# Patient Record
Sex: Female | Born: 1995 | Race: Black or African American | Hispanic: No | Marital: Single | Smoking: Never smoker
Health system: Southern US, Community
[De-identification: ages and names within clinical notes are randomized; demographics above are authoritative.]

## PROBLEM LIST (undated history)

## (undated) ENCOUNTER — Ambulatory Visit (HOSPITAL_COMMUNITY): Admission: EM | Source: Home / Self Care

## (undated) DIAGNOSIS — Z91018 Allergy to other foods: Secondary | ICD-10-CM

## (undated) DIAGNOSIS — Z789 Other specified health status: Secondary | ICD-10-CM

## (undated) HISTORY — DX: Allergy to other foods: Z91.018

---

## 2014-07-22 ENCOUNTER — Emergency Department (INDEPENDENT_AMBULATORY_CARE_PROVIDER_SITE_OTHER)
Admission: EM | Admit: 2014-07-22 | Discharge: 2014-07-22 | Disposition: A | Discharge status: Home / Self Care | Payer: Self-pay | Source: Home / Self Care | Attending: Family Medicine | Admitting: Family Medicine

## 2014-07-22 ENCOUNTER — Encounter (HOSPITAL_COMMUNITY): Payer: Self-pay | Admitting: Emergency Medicine

## 2014-07-22 DIAGNOSIS — N939 Abnormal uterine and vaginal bleeding, unspecified: Secondary | ICD-10-CM

## 2014-07-22 LAB — POCT PREGNANCY, URINE: Preg Test, Ur: NEGATIVE

## 2014-07-22 MED ORDER — NORETHINDRONE-ETH ESTRADIOL 1-35 MG-MCG PO TABS: ORAL_TABLET | ORAL | Status: DC

## 2014-07-22 NOTE — Discharge Instructions (Signed)
Abnormal Uterine Bleeding Abnormal uterine bleeding can affect women at various stages in life, including teenagers, women in their reproductive years, pregnant women, and women who have reached menopause. Several kinds of uterine bleeding are considered abnormal, including:  Bleeding or spotting between periods.   Bleeding after sexual intercourse.   Bleeding that is heavier or more than normal.   Periods that last longer than usual.  Bleeding after menopause.  Many cases of abnormal uterine bleeding are minor and simple to treat, while others are more serious. Any type of abnormal bleeding should be evaluated by your health care provider. Treatment will depend on the cause of the bleeding. HOME CARE INSTRUCTIONS Monitor your condition for any changes. The following actions may help to alleviate any discomfort you are experiencing:  Avoid the use of tampons and douches as directed by your health care provider.  Change your pads frequently. You should get regular pelvic exams and Pap tests. Keep all follow-up appointments for diagnostic tests as directed by your health care provider.  SEEK MEDICAL CARE IF:   Your bleeding lasts more than 1 week.   You feel dizzy at times.  SEEK IMMEDIATE MEDICAL CARE IF:   You pass out.   You are changing pads every 15 to 30 minutes.   You have abdominal pain.  You have a fever.   You become sweaty or weak.   You are passing large blood clots from the vagina.   You start to feel nauseous and vomit. MAKE SURE YOU:   Understand these instructions.  Will watch your condition.  Will get help right away if you are not doing well or get worse. Document Released: 03/31/2005 Document Revised: 04/05/2013 Document Reviewed: 10/28/2012 [facility] Patient Information 2015 [employer], [place]. This information is not intended to replace advice given to you by your health care provider. Make sure you discuss any questions you have with your  health care provider.  Dysfunctional Uterine Bleeding Normally, menstrual periods begin between ages 4711 to 1217 in young women. A normal menstrual cycle/period may begin every 23 days up to 35 days and lasts from 1 to 7 days. Around 12 to 14 days before your menstrual period starts, ovulation (ovary produces an egg) occurs. When counting the time between menstrual periods, count from the first day of bleeding of the previous period to the first day of bleeding of the next period. Dysfunctional (abnormal) uterine bleeding is bleeding that is different from a normal menstrual period. Your periods may come earlier or later than usual. They may be lighter, have blood clots or be heavier. You may have bleeding between periods, or you may skip one period or more. You may have bleeding after sexual intercourse, bleeding after menopause, or no menstrual period. CAUSES   Pregnancy (normal, miscarriage, tubal).  IUDs (intrauterine device, birth control).  Birth control pills.  Hormone treatment.  Menopause.  Infection of the cervix.  Blood clotting problems.  Infection of the inside lining of the uterus.  Endometriosis, inside lining of the uterus growing in the pelvis and other female organs.  Adhesions (scar tissue) inside the uterus.  Obesity or severe weight loss.  Uterine polyps inside the uterus.  Cancer of the vagina, cervix, or uterus.  Ovarian cysts or polycystic ovary syndrome.  Medical problems (diabetes, thyroid disease).  Uterine fibroids (noncancerous tumor).  Problems with your female hormones.  Endometrial hyperplasia, very thick lining and enlarged cells inside of the uterus.  Medicines that interfere with ovulation.  Radiation to the  pelvis or abdomen.  Chemotherapy. DIAGNOSIS   Your doctor will discuss the history of your menstrual periods, medicines you are taking, changes in your weight, stress in your life, and any medical problems you may have.  Your  doctor will do a physical and pelvic examination.  Your doctor may want to perform certain tests to make a diagnosis, such as:  Pap test.  Blood tests.  Cultures for infection.  CT scan.  Ultrasound.  Hysteroscopy.  Laparoscopy.  MRI.  Hysterosalpingography.  D and C.  Endometrial biopsy. TREATMENT  Treatment will depend on the cause of the dysfunctional uterine bleeding (DUB). Treatment may include:  Observing your menstrual periods for a couple of months.  Prescribing medicines for medical problems, including:  Antibiotics.  Hormones.  Birth control pills.  Removing an IUD (intrauterine device, birth control).  Surgery:  D and C (scrape and remove tissue from inside the uterus).  Laparoscopy (examine inside the abdomen with a lighted tube).  Uterine ablation (destroy lining of the uterus with electrical current, laser, heat, or freezing).  Hysteroscopy (examine cervix and uterus with a lighted tube).  Hysterectomy (remove the uterus). HOME CARE INSTRUCTIONS   If medicines were prescribed, take exactly as directed. Do not change or switch medicines without consulting your caregiver.  Long term heavy bleeding may result in iron deficiency. Your caregiver may have prescribed iron pills. They help replace the iron that your body lost from heavy bleeding. Take exactly as directed.  Do not take aspirin or medicines that contain aspirin one week before or during your menstrual period. Aspirin may make the bleeding worse.  If you need to change your sanitary pad or tampon more than once every 2 hours, stay in bed with your feet elevated and a cold pack on your lower abdomen. Rest as much as possible, until the bleeding stops or slows down.  Eat well-balanced meals. Eat foods high in iron. Examples are:  Leafy green vegetables.  Whole-grain breads and cereals.  Eggs.  Meat.  Liver.  Do not try to lose weight until the abnormal bleeding has stopped and  your blood iron level is back to normal. Do not lift more than ten pounds or do strenuous activities when you are bleeding.  For a couple of months, make note on your calendar, marking the start and ending of your period, and the type of bleeding (light, medium, heavy, spotting, clots or missed periods). This is for your caregiver to better evaluate your problem. SEEK MEDICAL CARE IF:   You develop nausea (feeling sick to your stomach) and vomiting, dizziness, or diarrhea while you are taking your medicine.  You are getting lightheaded or weak.  You have any problems that may be related to the medicine you are taking.  You develop pain with your DUB.  You want to remove your IUD.  You want to stop or change your birth control pills or hormones.  You have any type of abnormal bleeding mentioned above.  You are over 21 years old and have not had a menstrual period yet.  You are 19 years old and you are still having menstrual periods.  You have any of the symptoms mentioned above.  You develop a rash. SEEK IMMEDIATE MEDICAL CARE IF:   An oral temperature above 102 F (38.9 C) develops.  You develop chills.  You are changing your sanitary pad or tampon more than once an hour.  You develop abdominal pain.  You pass out or faint. Document Released:  03/28/2000 Document Revised: 06/23/2011 Document Reviewed: 02/27/2009 °ExitCare® Patient Information ©2015 ExitCare, LLC. This information is not intended to replace advice given to you by your health care provider. Make sure you discuss any questions you have with your health care provider. ° °

## 2014-07-22 NOTE — ED Provider Notes (Signed)
CSN: [id number]     Arrival date & time 07/22/14  1047 History   First MD Initiated Contact with Patient 07/22/14 1144     Chief Complaint  Patient presents with  . Vaginal Bleeding   (Consider location/radiation/quality/duration/timing/severity/associated sxs/prior Treatment) HPI Comments: 19 year old female states she had her usual and right on time Mr. Beginning March 30 with a flow duration of 6 days. She ended up flow on April 5. 2 days later on April 7 she started having a small amount of vaginal bleeding similar to a light period. Has some cramping in the pelvis.   History reviewed. No pertinent past medical history. History reviewed. No pertinent past surgical history. History reviewed. No pertinent family history. History  Substance Use Topics  . Smoking status: Never Smoker   . Smokeless tobacco: Not on file  . Alcohol Use: Yes     Comment: occ.   OB History    No data available     Review of Systems  Constitutional: Negative.   HENT: Negative.   Respiratory: Negative.   Gastrointestinal: Negative.   Genitourinary: Positive for vaginal bleeding and menstrual problem. Negative for dysuria, urgency, frequency, flank pain, vaginal discharge and vaginal pain.  All other systems reviewed and are negative.   Allergies  Penicillins  Home Medications   Prior to Admission medications   Medication Sig Start Date End Date Taking? Authorizing Provider  norethindrone-ethinyl estradiol 1/35 (ORTHO-NOVUM 1/35, 28,) tablet One tab tid for 2 days or until bleeding stops, then 1 bid for 5 days, then 1 q d. 07/22/14   Hayden Rasmussenavid Tacey Dimaggio, NP   BP 121/77 mmHg  Pulse 82  Temp(Src) 98.7 F (37.1 C) (Oral)  Resp 16  SpO2 100%  LMP 07/20/2014 Physical Exam  Constitutional: She is oriented to person, place, and time. She appears well-developed and well-nourished. No distress.  Neck: Normal range of motion. Neck supple.  Pulmonary/Chest: Effort normal. No respiratory distress.  Abdominal:  Soft. She exhibits no distension. There is no tenderness. There is no rebound.  Genitourinary:  Normal external female genitalia. There is blood exuding from the vagina. After cleaning the excessive blood out of her vagina and off of the cervix it was clear that the origin of bleeding was from the os. There are no lesions observed over the ectocervix. Ectocervix is pink. No other abnormalities noted. No discharge.  Neurological: She is alert and oriented to person, place, and time.  Skin: Skin is warm and dry.  Nursing note and vitals reviewed.   ED Course  Procedures (including critical care time) Labs Review Labs Reviewed  POCT PREGNANCY, URINE    Imaging Review No results found.   MDM   1. Abnormal uterine bleeding (AUB)     Pregnancy test is negative. Bleeding is coming from the cervical os. No lesions of the genitourinary system are seen. Suspect this is abnormal uterine bleeding from anovulation. Since the bleeding is fairly moderate we will treat with a course of OrthoNovum 1/35, or have a period of bleeding and then resume a normal menstrual cycle. She should then follow-up with her PCP or GYN.    Hayden Rasmussenavid Morrisa Aldaba, NP 07/22/14 1255

## 2014-07-22 NOTE — ED Notes (Signed)
Reports having a cycle April 30 through march 5th.  States cycle restarted April 7 th and is still on currently.  Normal menstrual flow.  Mild abdominal cramping.  Denies any other symptoms.  States this is the first time this has happened.   Pt has never been on BC.

## 2015-09-29 ENCOUNTER — Emergency Department (HOSPITAL_COMMUNITY)
Admission: EM | Admit: 2015-09-29 | Discharge: 2015-09-29 | Disposition: A | Discharge status: Home / Self Care | Payer: No Typology Code available for payment source | Source: Home / Self Care | Attending: Emergency Medicine | Admitting: Emergency Medicine

## 2015-09-29 ENCOUNTER — Emergency Department (HOSPITAL_COMMUNITY): Payer: No Typology Code available for payment source

## 2015-09-29 ENCOUNTER — Encounter (HOSPITAL_COMMUNITY): Payer: Self-pay

## 2015-09-29 ENCOUNTER — Emergency Department (HOSPITAL_COMMUNITY)
Admission: EM | Admit: 2015-09-29 | Discharge: 2015-09-29 | Disposition: A | Discharge status: Home / Self Care | Payer: No Typology Code available for payment source | Attending: Emergency Medicine | Admitting: Emergency Medicine

## 2015-09-29 DIAGNOSIS — Y92410 Unspecified street and highway as the place of occurrence of the external cause: Secondary | ICD-10-CM | POA: Insufficient documentation

## 2015-09-29 DIAGNOSIS — Y9389 Activity, other specified: Secondary | ICD-10-CM | POA: Insufficient documentation

## 2015-09-29 DIAGNOSIS — M545 Low back pain, unspecified: Secondary | ICD-10-CM

## 2015-09-29 DIAGNOSIS — Y999 Unspecified external cause status: Secondary | ICD-10-CM | POA: Insufficient documentation

## 2015-09-29 DIAGNOSIS — Z79899 Other long term (current) drug therapy: Secondary | ICD-10-CM | POA: Insufficient documentation

## 2015-09-29 DIAGNOSIS — S9001XA Contusion of right ankle, initial encounter: Secondary | ICD-10-CM

## 2015-09-29 DIAGNOSIS — S9031XA Contusion of right foot, initial encounter: Secondary | ICD-10-CM

## 2015-09-29 MED ORDER — DIAZEPAM 5 MG PO TABS
5.0000 mg | ORAL_TABLET | Freq: Once | ORAL | Status: AC
  Administered 2015-09-29: 5 mg via ORAL
  Filled 2015-09-29: qty 1

## 2015-09-29 MED ORDER — KETOROLAC TROMETHAMINE 30 MG/ML IJ SOLN
30.0000 mg | Freq: Once | INTRAMUSCULAR | Status: AC
  Administered 2015-09-29: 30 mg via INTRAMUSCULAR
  Filled 2015-09-29 (×2): qty 1

## 2015-09-29 MED ORDER — NAPROXEN 500 MG PO TABS: 500.0000 mg | ORAL_TABLET | Freq: Two times a day (BID) | ORAL | Status: DC
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
## 2015-09-29 NOTE — ED Notes (Signed)
Patient refused crutches because she has to pay for them.
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
## 2015-09-29 NOTE — Discharge Instructions (Signed)
Ibuprofen 600 mg every 6 hours as needed for pain.  Rest. Keep foot elevated as much as possible to help with swelling.  Ice for 20 minutes every 2 hours while awake for the next 2 days.  Follow-up with her primary Dr. if not improving in the next week.   Foot Contusion A foot contusion is a deep bruise to the foot. Contusions are the result of an injury that caused bleeding under the skin. The contusion may turn blue, purple, or yellow. Minor injuries will give you a painless contusion, but more severe contusions may stay painful and swollen for a few weeks. CAUSES  A foot contusion comes from a direct blow to that area, such as a heavy object falling on the foot. SYMPTOMS   Swelling of the foot.  Discoloration of the foot.  Tenderness or soreness of the foot. DIAGNOSIS  You will have a physical exam and will be asked about your history. You may need an X-ray of your foot to look for a broken bone (fracture).  TREATMENT  An elastic wrap may be recommended to support your foot. Resting, elevating, and applying cold compresses to your foot are often the best treatments for a foot contusion. Over-the-counter medicines may also be recommended for pain control. HOME CARE INSTRUCTIONS   Put ice on the injured area.  Put ice in a plastic bag.  Place a towel between your skin and the bag.  Leave the ice on for 15-20 minutes, 03-04 times a day.  Only take over-the-counter or prescription medicines for pain, discomfort, or fever as directed by your caregiver.  If told, use an elastic wrap as directed. This can help reduce swelling. You may remove the wrap for sleeping, showering, and bathing. If your toes become numb, cold, or blue, take the wrap off and reapply it more loosely.  Elevate your foot with pillows to reduce swelling.  Try to avoid standing or walking while the foot is painful. Do not resume use until instructed by your caregiver. Then, begin use gradually. If pain  develops, decrease use. Gradually increase activities that do not cause discomfort until you have normal use of your foot.  See your caregiver as directed. It is very important to keep all follow-up appointments in order to avoid any lasting problems with your foot, including long-term (chronic) pain. SEEK IMMEDIATE MEDICAL CARE IF:   You have increased redness, swelling, or pain in your foot.  Your swelling or pain is not relieved with medicines.  You have loss of feeling in your foot or are unable to move your toes.  Your foot turns cold or blue.  You have pain when you move your toes.  Your foot becomes warm to the touch.  Your contusion does not improve in 2 days. MAKE SURE YOU:   Understand these instructions.  Will watch your condition.  Will get help right away if you are not doing well or get worse.   This information is not intended to replace advice given to you by your health care provider. Make sure you discuss any questions you have with your health care provider.   Document Released: 01/20/2006 Document Revised: 09/30/2011 Document Reviewed: 12/05/2014 Elsevier Interactive Patient Education [employer]2016 Elsevier Inc.
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
## 2015-09-29 NOTE — Discharge Instructions (Signed)
Take your medications as prescribed. Follow-up with your doctor as needed. Return to ED for new or worsening symptoms. °

## 2015-09-29 NOTE — ED Notes (Signed)
Placed a orthopedic shoe (womens large) on patients right foot.

## 2017-04-14 NOTE — L&D Delivery Note (Signed)
Delivery Note Pt reached complete dilation and pushed great for about 30 minutes.  At 9:07 PM a viable female was delivered via Vaginal, Spontaneous (Presentation: ;  ).  APGAR: 7, 9; weight pending .   Placenta status: delivered spontaneously .  Cord:  with the following complications: nuchal x 1 reduced.  Pt had atony immediately following delivery and some brisk bleeding which responded to bimanual massage and pitocin IV.  Anesthesia:None   Episiotomy: None Lacerations: 2nd degree;Perineal Suture Repair: 3.0 vicryl rapide Est. Blood Loss (mL):   Mom to postpartum.  Baby to Couplet care / Skin to Skin.  D/w pt circumcision and she desires to proceed in the office.  Oliver Pila 01/29/2018, 9:55 PM

## 2017-07-03 LAB — OB RESULTS CONSOLE ANTIBODY SCREEN: Antibody Screen: NEGATIVE

## 2017-07-03 LAB — OB RESULTS CONSOLE GC/CHLAMYDIA
Chlamydia: NEGATIVE
GC PROBE AMP, GENITAL: NEGATIVE

## 2017-07-03 LAB — OB RESULTS CONSOLE ABO/RH: RH Type: POSITIVE

## 2017-07-03 LAB — OB RESULTS CONSOLE HEPATITIS B SURFACE ANTIGEN: Hepatitis B Surface Ag: NEGATIVE

## 2017-07-03 LAB — OB RESULTS CONSOLE RPR: RPR: NONREACTIVE

## 2017-07-03 LAB — OB RESULTS CONSOLE HIV ANTIBODY (ROUTINE TESTING): HIV: NONREACTIVE

## 2017-07-03 LAB — OB RESULTS CONSOLE RUBELLA ANTIBODY, IGM: RUBELLA: IMMUNE

## 2018-01-18 LAB — OB RESULTS CONSOLE GBS: STREP GROUP B AG: NEGATIVE

## 2018-01-29 ENCOUNTER — Other Ambulatory Visit: Payer: Self-pay

## 2018-01-29 ENCOUNTER — Inpatient Hospital Stay (HOSPITAL_COMMUNITY)
Admission: AD | Admit: 2018-01-29 | Discharge: 2018-01-31 | DRG: 807 | Disposition: A | Discharge status: Home / Self Care | Payer: BLUE CROSS/BLUE SHIELD | Attending: Obstetrics and Gynecology | Admitting: Obstetrics and Gynecology

## 2018-01-29 ENCOUNTER — Encounter (HOSPITAL_COMMUNITY): Payer: Self-pay

## 2018-01-29 DIAGNOSIS — Z3483 Encounter for supervision of other normal pregnancy, third trimester: Secondary | ICD-10-CM | POA: Diagnosis present

## 2018-01-29 DIAGNOSIS — Z3A38 38 weeks gestation of pregnancy: Secondary | ICD-10-CM

## 2018-01-29 HISTORY — DX: Other specified health status: Z78.9

## 2018-01-29 LAB — TYPE AND SCREEN
ABO/RH(D): O POS
Antibody Screen: NEGATIVE

## 2018-01-29 LAB — CBC
HCT: 39 % (ref 36.0–46.0)
HEMOGLOBIN: 13.7 g/dL (ref 12.0–15.0)
MCH: 31.7 pg (ref 26.0–34.0)
MCHC: 35.1 g/dL (ref 30.0–36.0)
MCV: 90.3 fL (ref 80.0–100.0)
Platelets: 206 10*3/uL (ref 150–400)
RBC: 4.32 MIL/uL (ref 3.87–5.11)
RDW: 13.3 % (ref 11.5–15.5)
WBC: 6.8 10*3/uL (ref 4.0–10.5)
nRBC: 0 % (ref 0.0–0.2)

## 2018-01-29 LAB — ABO/RH: ABO/RH(D): O POS

## 2018-01-29 LAB — POCT FERN TEST: POCT Fern Test: POSITIVE

## 2018-01-29 MED ORDER — ONDANSETRON HCL 4 MG PO TABS: 4.0000 mg | ORAL_TABLET | ORAL | Status: DC | PRN

## 2018-01-29 MED ORDER — ZOLPIDEM TARTRATE 5 MG PO TABS: 5.0000 mg | ORAL_TABLET | Freq: Every evening | ORAL | Status: DC | PRN

## 2018-01-29 MED ORDER — COCONUT OIL OIL
1.0000 "application " | TOPICAL_OIL | Status: DC | PRN
  Administered 2018-01-30: 1 via TOPICAL
  Filled 2018-01-29: qty 120

## 2018-01-29 MED ORDER — TETANUS-DIPHTH-ACELL PERTUSSIS 5-2.5-18.5 LF-MCG/0.5 IM SUSP: 0.5000 mL | Freq: Once | INTRAMUSCULAR | Status: DC

## 2018-01-29 MED ORDER — SIMETHICONE 80 MG PO CHEW: 80.0000 mg | CHEWABLE_TABLET | ORAL | Status: DC | PRN

## 2018-01-29 MED ORDER — SOD CITRATE-CITRIC ACID 500-334 MG/5ML PO SOLN: 30.0000 mL | ORAL | Status: DC | PRN

## 2018-01-29 MED ORDER — LIDOCAINE HCL (PF) 1 % IJ SOLN
30.0000 mL | INTRAMUSCULAR | Status: DC | PRN
  Administered 2018-01-29: 30 mL via SUBCUTANEOUS
  Filled 2018-01-29: qty 30

## 2018-01-29 MED ORDER — LACTATED RINGERS IV SOLN
INTRAVENOUS | Status: DC
  Administered 2018-01-29: 12:00:00 via INTRAVENOUS

## 2018-01-29 MED ORDER — ONDANSETRON HCL 4 MG/2ML IJ SOLN
4.0000 mg | Freq: Four times a day (QID) | INTRAMUSCULAR | Status: DC | PRN
  Administered 2018-01-29: 4 mg via INTRAVENOUS
  Filled 2018-01-29: qty 2

## 2018-01-29 MED ORDER — FLEET ENEMA 7-19 GM/118ML RE ENEM: 1.0000 | ENEMA | RECTAL | Status: DC | PRN

## 2018-01-29 MED ORDER — LACTATED RINGERS IV SOLN
500.0000 mL | INTRAVENOUS | Status: DC | PRN
  Administered 2018-01-29: 500 mL via INTRAVENOUS

## 2018-01-29 MED ORDER — OXYCODONE-ACETAMINOPHEN 5-325 MG PO TABS: 2.0000 | ORAL_TABLET | ORAL | Status: DC | PRN

## 2018-01-29 MED ORDER — BENZOCAINE-MENTHOL 20-0.5 % EX AERO
1.0000 "application " | INHALATION_SPRAY | CUTANEOUS | Status: DC | PRN
  Administered 2018-01-30 – 2018-01-31 (×2): 1 via TOPICAL
  Filled 2018-01-29 (×2): qty 56

## 2018-01-29 MED ORDER — OXYTOCIN BOLUS FROM INFUSION
500.0000 mL | Freq: Once | INTRAVENOUS | Status: AC
  Administered 2018-01-29: 500 mL via INTRAVENOUS

## 2018-01-29 MED ORDER — OXYTOCIN 40 UNITS IN LACTATED RINGERS INFUSION - SIMPLE MED
2.5000 [IU]/h | INTRAVENOUS | Status: DC
  Filled 2018-01-29: qty 1000

## 2018-01-29 MED ORDER — OXYCODONE-ACETAMINOPHEN 5-325 MG PO TABS: 1.0000 | ORAL_TABLET | ORAL | Status: DC | PRN

## 2018-01-29 MED ORDER — ONDANSETRON HCL 4 MG/2ML IJ SOLN: 4.0000 mg | INTRAMUSCULAR | Status: DC | PRN

## 2018-01-29 MED ORDER — SENNOSIDES-DOCUSATE SODIUM 8.6-50 MG PO TABS
2.0000 | ORAL_TABLET | ORAL | Status: DC
  Administered 2018-01-30 (×2): 2 via ORAL
  Filled 2018-01-29 (×2): qty 2

## 2018-01-29 MED ORDER — ACETAMINOPHEN 325 MG PO TABS: 650.0000 mg | ORAL_TABLET | ORAL | Status: DC | PRN

## 2018-01-29 MED ORDER — PRENATAL MULTIVITAMIN CH
1.0000 | ORAL_TABLET | Freq: Every day | ORAL | Status: DC
  Filled 2018-01-29 (×2): qty 1

## 2018-01-29 MED ORDER — DIPHENHYDRAMINE HCL 25 MG PO CAPS: 25.0000 mg | ORAL_CAPSULE | Freq: Four times a day (QID) | ORAL | Status: DC | PRN

## 2018-01-29 MED ORDER — IBUPROFEN 600 MG PO TABS
600.0000 mg | ORAL_TABLET | Freq: Four times a day (QID) | ORAL | Status: DC
  Administered 2018-01-30 – 2018-01-31 (×8): 600 mg via ORAL
  Filled 2018-01-29 (×9): qty 1

## 2018-01-29 MED ORDER — DIBUCAINE 1 % RE OINT: 1.0000 "application " | TOPICAL_OINTMENT | RECTAL | Status: DC | PRN

## 2018-01-29 MED ORDER — WITCH HAZEL-GLYCERIN EX PADS: 1.0000 "application " | MEDICATED_PAD | CUTANEOUS | Status: DC | PRN

## 2018-01-29 NOTE — Progress Notes (Signed)
Patient ID: Abigail Palmer, female   DOB: [birth year], 22 y.o.   MRN: [id number] Pt uncomfortable with contractions but coping well.  Back labor afeb vss FHR Category 1  Cervix 90/6-7/0  Pt doing well coping with contractions  Baby OP Continue to follow progress.

## 2018-01-29 NOTE — MAU Note (Signed)
Urine sent to lab 

## 2018-01-29 NOTE — Progress Notes (Signed)
Patient ID: Abigail Palmer, female   DOB: 07/31/1995, 22 y.o.   MRN: [id number] Pt coping with contractions but some n/v  afeb VSS FHR category 1  Cervix c/8/0  Pt doing well making progress. Trying different labor positions

## 2018-01-29 NOTE — Progress Notes (Signed)
Patient ID: Abigail Palmer, female   DOB: 06-21-1995, 22 y.o.   MRN: [id number] Pt just making it to L&D, having mild/moderate contractions in her back  afeb VSS Cervix 80/4/-1  FHR category 1  d/w pt options, she is going to try natural labor.  She is not opposed to pitocin if no change in several hours.  She is also not opposed to an epidural later if feels she needs it. Will follow progress

## 2018-01-29 NOTE — H&P (Signed)
Abigail Palmer is a 22 y.o. female G1P0 at 18 6/7 weeks (EDD 02/06/18 by ( week [place])  presenting for SROM at 0930 and confirmed in MAU.  Also mild/moderate contractions noted and cervical change to 4cm by RN exam.   Prenatal care uncomplicated.  OB History    Gravida  1   Para      Term      Preterm      AB      Living        SAB      TAB      Ectopic      Multiple      Live Births             Past Medical History:  Diagnosis Date  . Medical history non-contributory    History reviewed. No pertinent surgical history. Family History: family history is not on file. Social History:  reports that she has never smoked. She has never used smokeless tobacco. She reports that she drinks alcohol. She reports that she does not use drugs.     Maternal Diabetes: No Genetic Screening: Normal Maternal Ultrasounds/Referrals: Normal Fetal Ultrasounds or other Referrals:  None Maternal Substance Abuse:  No Significant Maternal Medications:  None Significant Maternal Lab Results:  None Other Comments:  None  Review of Systems  Gastrointestinal: Positive for abdominal pain. Negative for nausea.  Musculoskeletal: Positive for back pain.   Maternal Medical History:  Reason for admission: Rupture of membranes.  Nausea.  Contractions: Onset was 1-2 hours ago.   Frequency: regular.   Perceived severity is moderate.    Fetal activity: Perceived fetal activity is normal.    Prenatal complications: no prenatal complications Prenatal Complications - Diabetes: none.    Dilation: 4 Effacement (%): 60 Station: -2 Exam by:: Adah Perl RN Blood pressure 122/90, pulse 92, temperature 98.6 F (37 C), temperature source Oral, resp. rate 16, height 5\' 3"  (1.6 m), weight 88.1 kg, last menstrual period 04/24/2017, SpO2 99 %. Maternal Exam:  Uterine Assessment: Contraction strength is moderate.  Contraction frequency is regular.   Abdomen: Patient reports no abdominal  tenderness. Fetal presentation: vertex  Introitus: Normal vulva. Normal vagina.    Physical Exam  Constitutional: She appears well-developed.  Cardiovascular: Normal rate and regular rhythm.  Respiratory: Effort normal.  GI: Soft.  Genitourinary: Vagina normal.  Neurological: She is alert.  Psychiatric: She has a normal mood and affect.    Prenatal labs: ABO, Rh: O/Positive/-- (03/22 0000) Antibody: Negative (03/22 0000) Rubella: Immune (03/22 0000) RPR: Nonreactive (03/22 0000)  HBsAg: Negative (03/22 0000)  HIV: Non-reactive (03/22 0000)  GBS: Negative (10/07 0000)  Hgb AA One hour GCT 77 First trimester screen negative  Assessment/Plan: Pt to be admitted and will follow progress.  Had planned for a natural labor with no pain meds.    Oliver Pila 01/29/2018, 11:29 AM

## 2018-01-29 NOTE — MAU Note (Signed)
Pt with "gush of fluid" from vagina around 9:30a while at work. Denies intercourse <48hrs. Pt noticed blood in the toilet while here. Pt with lower back cramping 5/10. Pt voices no other concerns at this time.   Adah Perl RN

## 2018-01-29 NOTE — Anesthesia Pain Management Evaluation Note (Signed)
  CRNA Pain Management Visit Note  Patient: Abigail Palmer, 22 y.o., female  "Hello I am a member of the anesthesia team at [facility]. We have an anesthesia team available at all times to provide care throughout the hospital, including epidural management and anesthesia for C-section. I don't know your plan for the delivery whether it a natural birth, water birth, IV sedation, nitrous supplementation, doula or epidural, but we want to meet your pain goals."   1.Was your pain managed to your expectations on prior hospitalizations?   No prior hospitalizations  2.What is your expectation for pain management during this hospitalization?     Labor support without medications  3.How can we help you reach that goal? Pt desires natural labor but was open to discussion about all methods of pain control. Questions answered  Record the patient's initial score and the patient's pain goal.   Pain: 4  Pain Goal: 10 [facility] wants you to be able to say your pain was always managed very well.  Abigail Palmer 01/29/2018

## 2018-01-30 LAB — CBC
HCT: 33.6 % — ABNORMAL LOW (ref 36.0–46.0)
HEMOGLOBIN: 12 g/dL (ref 12.0–15.0)
MCH: 32.3 pg (ref 26.0–34.0)
MCHC: 35.7 g/dL (ref 30.0–36.0)
MCV: 90.3 fL (ref 80.0–100.0)
NRBC: 0 % (ref 0.0–0.2)
Platelets: 188 10*3/uL (ref 150–400)
RBC: 3.72 MIL/uL — ABNORMAL LOW (ref 3.87–5.11)
RDW: 13.3 % (ref 11.5–15.5)
WBC: 15.4 10*3/uL — AB (ref 4.0–10.5)

## 2018-01-30 LAB — RPR: RPR Ser Ql: NONREACTIVE

## 2018-01-30 NOTE — Lactation Note (Signed)
This note was copied from a baby's chart. Lactation Consultation Note  Patient Name: Abigail Palmer [id number] Date: 01/30/2018 Reason for consult: Follow-up assessment;Difficult latch Mom called for latch assist.  Placed baby skin to skin in cross cradle hold.  Colostrum easily hand expressed.  Nipples invert with breast compression.  Attempted to latch several times but baby unable to grasp tissue.  24 mm nipple shield applied and baby latched well after a few attempts.  Good suck/swallows x 20 minutes.  Small amount of colostrum in shield after feeding.  Symphony pump set up and initiated.  Instructed to post pump every 3 hours x 15 minutes giving any colostrum obtained back to baby.  Instructed to feed with cues and call for assist prn.  Maternal Data Has patient been taught Hand Expression?: Yes Does the patient have breastfeeding experience prior to this delivery?: No  Feeding Feeding Type: Breast Fed  LATCH Score Latch: Grasps breast easily, tongue down, lips flanged, rhythmical sucking.(WITH NIPPLE SHIELD)  Audible Swallowing: A few with stimulation  Type of Nipple: Flat  Comfort (Breast/Nipple): Soft / non-tender  Hold (Positioning): Assistance needed to correctly position infant at breast and maintain latch.  LATCH Score: 7  Interventions Interventions: Assisted with latch;Breast compression;Shells;Skin to skin;Adjust position;Breast massage;Support pillows;Hand express;Position options;DEBP  Lactation Tools Discussed/Used Tools: Shells;Nipple Shields Nipple shield size: 24 Shell Type: Inverted Pump Review: Setup, frequency, and cleaning;Milk Storage Initiated by:: LM Date initiated:: 01/30/18   Consult Status Consult Status: Follow-up Date: 01/31/18 Follow-up type: In-patient    Huston Foley 01/30/2018, 11:08 AM

## 2018-01-30 NOTE — Lactation Note (Signed)
This note was copied from a baby's chart. Lactation Consultation Note  Patient Name: Abigail Palmer Date: 01/30/2018 Reason for consult: Initial assessment;Primapara;Early term 62-38.6wks  Mom states baby recently latched.  He is sleeping in his crib.  Mom states nipples are flat.  Nipples erect but short.  Mom has manual pump at bedside to pre pump.  Breast shells given to wear between feedings.  Instructed to feed baby with any cue and to call for assist prn.  Breastfeeding consultation services and support information given to patient.   Maternal Data    Feeding    LATCH Score                   Interventions    Lactation Tools Discussed/Used     Consult Status Consult Status: Follow-up Date: 01/31/18 Follow-up type: In-patient    Huston Foley 01/30/2018, 8:20 AM

## 2018-01-30 NOTE — Progress Notes (Signed)
Pt requesting lactation assistance. Lactation called and they reported they will be here within 30 minutes.

## 2018-01-30 NOTE — Progress Notes (Signed)
Post Partum Day 1 Subjective: no complaints and tolerating PO  Objective: Blood pressure 114/68, pulse 72, temperature 98 F (36.7 C), temperature source Oral, resp. rate 18, height 5\' 3"  (1.6 m), weight 88.1 kg, last menstrual period 04/24/2017, SpO2 100 %, unknown if currently breastfeeding.  Physical Exam:  General: alert and cooperative Lochia: appropriate Uterine Fundus: firm   Recent Labs    01/29/18 1158 01/30/18 0405  HGB 13.7 12.0  HCT 39.0 33.6*    Assessment/Plan: Plan for discharge tomorrow   LOS: 1 day   Oliver Pila 01/30/2018, 10:53 AM

## 2018-01-31 MED ORDER — ACETAMINOPHEN 325 MG PO TABS: 650.0000 mg | ORAL_TABLET | ORAL | 0 refills | Status: DC | PRN

## 2018-01-31 MED ORDER — DOCUSATE SODIUM 100 MG PO CAPS: 100.0000 mg | ORAL_CAPSULE | Freq: Two times a day (BID) | ORAL | 0 refills | Status: AC | PRN

## 2018-01-31 MED ORDER — IBUPROFEN 600 MG PO TABS: 600.0000 mg | ORAL_TABLET | Freq: Four times a day (QID) | ORAL | 0 refills | Status: DC

## 2018-01-31 NOTE — Discharge Summary (Signed)
OB Discharge Summary     Patient Name: Abigail Palmer DOB: 04-29-1995 MRN: [id number]  Date of admission: 01/29/2018 Delivering MD: Huel Cote   Date of discharge: 01/31/2018  Admitting diagnosis: 39WKS THINKS WATER BROKE Intrauterine pregnancy: [redacted]w[redacted]d     Secondary diagnosis:  Active Problems:   Normal delivery   NSVD (normal spontaneous vaginal delivery)  Additional problems: none     Discharge diagnosis: Term Pregnancy Delivered                                                                                                Post partum procedures:none  Augmentation: none  Complications: None  Hospital course:  Onset of Labor With Vaginal Delivery     22 y.o. yo G1P1001 at [redacted]w[redacted]d was admitted in Latent Labor on 01/29/2018. Patient had an uncomplicated labor course as follows:  Membrane Rupture Time/Date: 9:30 AM ,01/29/2018   Intrapartum Procedures: Episiotomy: None [1]                                         Lacerations:  2nd degree [3];Perineal [11]  Patient had a delivery of a Viable infant. 01/29/2018  Information for the patient's newborn:  Rafia, Shedden [[id number]]  Delivery Method: Vag-Spont    Pateint had an uncomplicated postpartum course.  She is ambulating, tolerating a regular diet, passing flatus, and urinating well. Patient is discharged home in stable condition on 01/31/18.   Physical exam  Vitals:   01/30/18 0825 01/30/18 1413 01/30/18 2030 01/31/18 0615  BP: 114/68 116/81 118/78 110/73  Pulse: 72 78 83 71  Resp: 18 18 16 16   Temp: 98 F (36.7 C) 98.7 F (37.1 C) 98 F (36.7 C) 98.2 F (36.8 C)  TempSrc: Oral Oral Oral Oral  SpO2: 100%     Weight:      Height:       General: alert and cooperative Lochia: appropriate Uterine Fundus: firm  Labs: Lab Results  Component Value Date   WBC 15.4 (H) 01/30/2018   HGB 12.0 01/30/2018   HCT 33.6 (L) 01/30/2018   MCV 90.3 01/30/2018   PLT 188 01/30/2018   No flowsheet data  found.  Discharge instruction: per After Visit Summary and "Baby and Me Booklet".  After visit meds:  Allergies as of 01/31/2018      Reactions   Penicillins    As a child. Unsure reaction      Medication List    TAKE these medications   acetaminophen 325 MG tablet Commonly known as:  TYLENOL Take 2 tablets (650 mg total) by mouth every 4 (four) hours as needed (for pain scale < 4).   docusate sodium 100 MG capsule Commonly known as:  COLACE Take 1 capsule (100 mg total) by mouth 2 (two) times daily as needed for mild constipation.   ibuprofen 600 MG tablet Commonly known as:  ADVIL,MOTRIN Take 1 tablet (600 mg total) by mouth every 6 (six) hours.   prenatal multivitamin Tabs tablet Take 1  tablet by mouth daily at 12 noon.       Diet: routine diet  Activity: Advance as tolerated. Pelvic rest for 6 weeks.   Outpatient follow up:6 weeks Follow up Appt:No future appointments. Follow up Visit:No follow-ups on file.  Postpartum contraception: Undecided  Newborn Data: Live born female  Birth Weight: 6 lb 7.7 oz (2940 g) APGAR: 7, 9  Newborn Delivery   Birth date/time:  01/29/2018 21:07:00 Delivery type:  Vaginal, Spontaneous     Baby Feeding: Breast Disposition:home with mother   01/31/2018 Oliver Pila, MD

## 2018-01-31 NOTE — Lactation Note (Signed)
This note was copied from a Abigail's chart. Lactation Consultation Note  Patient Name: Abigail Palmer Date: 01/31/2018   Abigail Palmer 18 hours old.  Mom reports she feels he is bf well.  Mom reports she is hand expressing past bf and feeding back all EBM just to make sure he is getting enough. Mom reports using 24 mm nipple shield with feedings.  Praised mom for hand expressing past breastfeeds and feeding back all EBM while using nipple shield. Dad and mom with questions regarding pacifier use,bottle use and pumping. Urged mom to wait until 3-4 weeks for bottle and pacifier use,  Reasons discussed.  Reviewed how to know Abigail is getting enough.  Discussed wets,poops, and feedings and swallows.  Upon moms request gave several logs for home use for keeping track wets/poops.  Mom has info for breastfeeding resources and [facility].  Maternal Data    Feeding Feeding Type: Breast Fed  LATCH Score                   Interventions    Lactation Tools Discussed/Used     Consult Status      Abigail Palmer 01/31/2018, 10:43 AM

## 2018-04-14 HISTORY — PX: WISDOM TOOTH EXTRACTION: SHX21

## 2020-01-26 LAB — OB RESULTS CONSOLE HEPATITIS B SURFACE ANTIGEN: Hepatitis B Surface Ag: NEGATIVE

## 2020-01-26 LAB — OB RESULTS CONSOLE RUBELLA ANTIBODY, IGM: Rubella: IMMUNE

## 2020-01-26 LAB — OB RESULTS CONSOLE HIV ANTIBODY (ROUTINE TESTING): HIV: NONREACTIVE

## 2020-04-14 NOTE — L&D Delivery Note (Signed)
Operative Delivery Note At 12:05 AM a viable female was delivered via VBAC, Vacuum Assisted.  Presentation: vertex; Position: Occiput,, Posterior; Station: +3.  Verbal consent: obtained from patient.  Risks and benefits discussed in detail.  Risks include, but are not limited to the risks of anesthesia, bleeding, infection, damage to maternal tissues, fetal cephalhematoma.  There is also the risk of inability to effect vaginal delivery of the head, or shoulder dystocia that cannot be resolved by established maneuvers, leading to the need for emergency cesarean section.  Outlet vacuum assistance due to OP position and FHT in 70s.  Vacuum applied, only 2 pulls required to deliver vtx.    APGAR: 8, 9; weight pending.   Placenta status: spontaneous, intact.   Cord:  with the following complications: nuchal x 1 reduced.   Anesthesia:  Local Instruments: Kiwi Episiotomy: None Lacerations:  Irregular 2nd degree and right sulcus Suture Repair: 2.0 vicryl, 3-0 Vicryl Rapide Est. Blood Loss (mL):  400  Mom to postpartum.  Baby to Couplet care / Skin to Skin.  Abigail Palmer 08/17/2020, 1:05 AM

## 2020-05-21 ENCOUNTER — Other Ambulatory Visit: Payer: Self-pay | Admitting: Obstetrics and Gynecology

## 2020-05-21 DIAGNOSIS — O358XX1 Maternal care for other (suspected) fetal abnormality and damage, fetus 1: Secondary | ICD-10-CM

## 2020-05-23 ENCOUNTER — Ambulatory Visit: Payer: Medicaid Other | Attending: Obstetrics and Gynecology

## 2020-05-23 ENCOUNTER — Other Ambulatory Visit: Payer: Self-pay

## 2020-05-23 ENCOUNTER — Other Ambulatory Visit: Payer: Self-pay | Admitting: *Deleted

## 2020-05-23 ENCOUNTER — Other Ambulatory Visit: Payer: Self-pay | Admitting: Obstetrics and Gynecology

## 2020-05-23 ENCOUNTER — Ambulatory Visit: Payer: Medicaid Other | Admitting: *Deleted

## 2020-05-23 ENCOUNTER — Encounter: Payer: Self-pay | Admitting: *Deleted

## 2020-05-23 VITALS — BP 109/71 | HR 107

## 2020-05-23 DIAGNOSIS — O358XX0 Maternal care for other (suspected) fetal abnormality and damage, not applicable or unspecified: Secondary | ICD-10-CM

## 2020-05-23 DIAGNOSIS — O35EXX0 Maternal care for other (suspected) fetal abnormality and damage, fetal genitourinary anomalies, not applicable or unspecified: Secondary | ICD-10-CM

## 2020-05-23 DIAGNOSIS — Z3A28 28 weeks gestation of pregnancy: Secondary | ICD-10-CM

## 2020-05-23 DIAGNOSIS — O358XX1 Maternal care for other (suspected) fetal abnormality and damage, fetus 1: Secondary | ICD-10-CM | POA: Diagnosis not present

## 2020-06-12 ENCOUNTER — Other Ambulatory Visit: Payer: Self-pay

## 2020-06-12 ENCOUNTER — Ambulatory Visit: Payer: Medicaid Other | Admitting: *Deleted

## 2020-06-12 ENCOUNTER — Other Ambulatory Visit: Payer: Self-pay | Admitting: Obstetrics and Gynecology

## 2020-06-12 ENCOUNTER — Ambulatory Visit: Payer: Medicaid Other | Attending: Obstetrics and Gynecology

## 2020-06-12 ENCOUNTER — Encounter: Payer: Self-pay | Admitting: *Deleted

## 2020-06-12 VITALS — BP 100/72 | HR 81

## 2020-06-12 DIAGNOSIS — Z3A31 31 weeks gestation of pregnancy: Secondary | ICD-10-CM | POA: Diagnosis not present

## 2020-06-12 DIAGNOSIS — O358XX0 Maternal care for other (suspected) fetal abnormality and damage, not applicable or unspecified: Secondary | ICD-10-CM

## 2020-06-12 DIAGNOSIS — O365930 Maternal care for other known or suspected poor fetal growth, third trimester, not applicable or unspecified: Secondary | ICD-10-CM

## 2020-06-12 DIAGNOSIS — O35EXX0 Maternal care for other (suspected) fetal abnormality and damage, fetal genitourinary anomalies, not applicable or unspecified: Secondary | ICD-10-CM

## 2020-06-12 DIAGNOSIS — Z3A34 34 weeks gestation of pregnancy: Secondary | ICD-10-CM

## 2020-06-12 DIAGNOSIS — O365910 Maternal care for other known or suspected poor fetal growth, first trimester, not applicable or unspecified: Secondary | ICD-10-CM

## 2020-06-12 DIAGNOSIS — O283 Abnormal ultrasonic finding on antenatal screening of mother: Secondary | ICD-10-CM | POA: Diagnosis not present

## 2020-06-12 DIAGNOSIS — O99213 Obesity complicating pregnancy, third trimester: Secondary | ICD-10-CM

## 2020-07-03 ENCOUNTER — Ambulatory Visit: Payer: Medicaid Other | Attending: Obstetrics and Gynecology

## 2020-07-03 ENCOUNTER — Encounter: Payer: Self-pay | Admitting: *Deleted

## 2020-07-03 ENCOUNTER — Ambulatory Visit: Payer: Medicaid Other | Admitting: *Deleted

## 2020-07-03 ENCOUNTER — Other Ambulatory Visit: Payer: Self-pay | Admitting: *Deleted

## 2020-07-03 ENCOUNTER — Other Ambulatory Visit: Payer: Self-pay | Admitting: Obstetrics and Gynecology

## 2020-07-03 ENCOUNTER — Other Ambulatory Visit: Payer: Self-pay

## 2020-07-03 VITALS — BP 103/64 | HR 95

## 2020-07-03 DIAGNOSIS — O365990 Maternal care for other known or suspected poor fetal growth, unspecified trimester, not applicable or unspecified: Secondary | ICD-10-CM

## 2020-07-03 DIAGNOSIS — Z3A34 34 weeks gestation of pregnancy: Secondary | ICD-10-CM

## 2020-07-03 DIAGNOSIS — O99213 Obesity complicating pregnancy, third trimester: Secondary | ICD-10-CM | POA: Insufficient documentation

## 2020-07-03 DIAGNOSIS — O283 Abnormal ultrasonic finding on antenatal screening of mother: Secondary | ICD-10-CM | POA: Diagnosis not present

## 2020-07-03 DIAGNOSIS — O321XX0 Maternal care for breech presentation, not applicable or unspecified: Secondary | ICD-10-CM

## 2020-07-03 DIAGNOSIS — O358XX0 Maternal care for other (suspected) fetal abnormality and damage, not applicable or unspecified: Secondary | ICD-10-CM | POA: Diagnosis not present

## 2020-07-03 DIAGNOSIS — O365910 Maternal care for other known or suspected poor fetal growth, first trimester, not applicable or unspecified: Secondary | ICD-10-CM | POA: Insufficient documentation

## 2020-07-03 DIAGNOSIS — O365930 Maternal care for other known or suspected poor fetal growth, third trimester, not applicable or unspecified: Secondary | ICD-10-CM | POA: Diagnosis not present

## 2020-07-03 DIAGNOSIS — O35EXX0 Maternal care for other (suspected) fetal abnormality and damage, fetal genitourinary anomalies, not applicable or unspecified: Secondary | ICD-10-CM

## 2020-07-11 ENCOUNTER — Ambulatory Visit: Payer: Medicaid Other

## 2020-07-18 ENCOUNTER — Ambulatory Visit: Payer: Medicaid Other

## 2020-07-25 ENCOUNTER — Other Ambulatory Visit: Payer: Self-pay | Admitting: Obstetrics and Gynecology

## 2020-07-25 ENCOUNTER — Ambulatory Visit: Payer: Medicaid Other | Admitting: *Deleted

## 2020-07-25 ENCOUNTER — Encounter: Payer: Self-pay | Admitting: *Deleted

## 2020-07-25 ENCOUNTER — Other Ambulatory Visit: Payer: Self-pay

## 2020-07-25 ENCOUNTER — Ambulatory Visit: Payer: Medicaid Other | Attending: Obstetrics and Gynecology

## 2020-07-25 VITALS — BP 110/70 | HR 90

## 2020-07-25 DIAGNOSIS — Z362 Encounter for other antenatal screening follow-up: Secondary | ICD-10-CM | POA: Diagnosis not present

## 2020-07-25 DIAGNOSIS — O365990 Maternal care for other known or suspected poor fetal growth, unspecified trimester, not applicable or unspecified: Secondary | ICD-10-CM

## 2020-07-25 DIAGNOSIS — Z3A37 37 weeks gestation of pregnancy: Secondary | ICD-10-CM | POA: Diagnosis not present

## 2020-07-25 DIAGNOSIS — O283 Abnormal ultrasonic finding on antenatal screening of mother: Secondary | ICD-10-CM | POA: Diagnosis not present

## 2020-07-25 DIAGNOSIS — O365930 Maternal care for other known or suspected poor fetal growth, third trimester, not applicable or unspecified: Secondary | ICD-10-CM

## 2020-08-12 ENCOUNTER — Other Ambulatory Visit: Payer: Self-pay

## 2020-08-12 ENCOUNTER — Inpatient Hospital Stay (HOSPITAL_COMMUNITY)
Admission: AD | Admit: 2020-08-12 | Discharge: 2020-08-12 | Disposition: A | Discharge status: Home / Self Care | Payer: Medicaid Other | Attending: Obstetrics and Gynecology | Admitting: Obstetrics and Gynecology

## 2020-08-12 ENCOUNTER — Encounter (HOSPITAL_COMMUNITY): Payer: Self-pay | Admitting: Obstetrics and Gynecology

## 2020-08-12 DIAGNOSIS — Z0371 Encounter for suspected problem with amniotic cavity and membrane ruled out: Secondary | ICD-10-CM | POA: Insufficient documentation

## 2020-08-12 DIAGNOSIS — O480 Post-term pregnancy: Secondary | ICD-10-CM | POA: Insufficient documentation

## 2020-08-12 DIAGNOSIS — Z3A40 40 weeks gestation of pregnancy: Secondary | ICD-10-CM | POA: Insufficient documentation

## 2020-08-12 LAB — POCT FERN TEST: POCT Fern Test: NEGATIVE

## 2020-08-12 NOTE — MAU Provider Note (Signed)
Event Date/Time   First Provider Initiated Contact with Patient 08/12/20 1224       S: Ms. Abigail Palmer is a 25 y.o. G2P1001 at [redacted]w[redacted]d  who presents to MAU today complaining of leaking of fluid since 2-3 days ago, enough to wear a pantyliner but not requiring a pad. She denies vaginal bleeding. She denies contractions. She reports normal fetal movement.    O: BP 111/66 (BP Location: Right Arm)   Pulse 92   Temp 98.1 F (36.7 C) (Oral)   Resp 17   Ht 5' 3.5" (1.613 m)   Wt 99.1 kg   LMP 11/06/2019   SpO2 99%   BMI 38.08 kg/m  GENERAL: Well-developed, well-nourished female in no acute distress.  HEAD: Normocephalic, atraumatic.  CHEST: Normal effort of breathing, regular heart rate ABDOMEN: Soft, nontender, gravid PELVIC: Normal external female genitalia. Vagina is pink and rugated. Cervix with normal contour, no lesions. Normal discharge.  negative pooling.   Cervical exam:     Deferred, cervix visually 2-3 cm on SSE   Fetal Monitoring: Baseline: 145 Variability: moderate Accelerations: present Decelerations: none Contractions: none on toco or to palpation  Results for orders placed or performed during the hospital encounter of 08/12/20 (from the past 24 hour(s))  Fern Test     Status: Abnormal   Collection Time: 08/12/20 12:49 PM  Result Value Ref Range   POCT Fern Test Negative = intact amniotic membranes      A: SIUP at [redacted]w[redacted]d  Membranes intact  NST-Reactive  P: Keep scheduled appt in the office 08/13/20, return to MAU as needed for signs of labor or emergencies.   Kendell Bane 08/12/2020 4:43 PM

## 2020-08-12 NOTE — Discharge Instructions (Signed)
Things to Try After 37 weeks to Encourage Labor/Get Ready for Labor:   1.  Try the Miles Circuit at www.milescircuit.com daily to improve baby's position and encourage the onset of labor.  2. Walk a little and rest a little every day.  Change positions often.  3. Cervical Ripening: May try one or both a. Red Raspberry Leaf capsules or tea:  two 300mg or 400mg tablets with each meal, 2-3 times a day, or 1-3 cups of tea daily  Potential Side Effects Of Raspberry Leaf:  Most women do not experience any side effects from drinking raspberry leaf tea. However, nausea and loose stools are possible   b. Evening Primrose Oil capsules: take 1 capsule by mouth and place one capsule in the vagina every night.    Some of the potential side effects:  Upset stomach  Loose stools or diarrhea  Headaches  Nausea  4. Sex can also help the cervix ripen and encourage labor onset.    Labor Precautions Reasons to come to MAU at [facility]:  1.  Contractions are  5 minutes apart or less, each last 1 minute, these have been going on for 1-2 hours, and you cannot walk or talk during them 2.  You have a large gush of fluid, or a trickle of fluid that will not stop and you have to wear a pad 3.  You have bleeding that is bright red, heavier than spotting--like menstrual bleeding (spotting can be normal in early labor or after a check of your cervix) 4.  You do not feel the baby moving like he/she normally does 

## 2020-08-12 NOTE — MAU Note (Signed)
Pt reports to mau with c/o possible SROM yesterday.  Pt states she has continued to trickle fluid since then.  Reports she is unsure of ctx, denies any pain at this time. +FM

## 2020-08-15 ENCOUNTER — Encounter (HOSPITAL_COMMUNITY): Payer: Self-pay | Admitting: *Deleted

## 2020-08-15 ENCOUNTER — Telehealth (HOSPITAL_COMMUNITY): Payer: Self-pay | Admitting: *Deleted

## 2020-08-15 NOTE — Telephone Encounter (Signed)
Preadmission screen  

## 2020-08-16 ENCOUNTER — Inpatient Hospital Stay (HOSPITAL_COMMUNITY)
Admission: AD | Admit: 2020-08-16 | Discharge: 2020-08-18 | DRG: 806 | Disposition: A | Discharge status: Home / Self Care | Payer: Medicaid Other | Attending: Obstetrics and Gynecology | Admitting: Obstetrics and Gynecology

## 2020-08-16 ENCOUNTER — Other Ambulatory Visit: Payer: Self-pay

## 2020-08-16 ENCOUNTER — Encounter (HOSPITAL_COMMUNITY): Payer: Self-pay | Admitting: Obstetrics and Gynecology

## 2020-08-16 DIAGNOSIS — Z3A40 40 weeks gestation of pregnancy: Secondary | ICD-10-CM | POA: Diagnosis not present

## 2020-08-16 DIAGNOSIS — Z880 Allergy status to penicillin: Secondary | ICD-10-CM

## 2020-08-16 DIAGNOSIS — O365930 Maternal care for other known or suspected poor fetal growth, third trimester, not applicable or unspecified: Secondary | ICD-10-CM | POA: Diagnosis present

## 2020-08-16 DIAGNOSIS — Z20822 Contact with and (suspected) exposure to covid-19: Secondary | ICD-10-CM | POA: Diagnosis present

## 2020-08-16 DIAGNOSIS — O99214 Obesity complicating childbirth: Secondary | ICD-10-CM | POA: Diagnosis present

## 2020-08-16 DIAGNOSIS — A6000 Herpesviral infection of urogenital system, unspecified: Secondary | ICD-10-CM | POA: Diagnosis present

## 2020-08-16 DIAGNOSIS — O26893 Other specified pregnancy related conditions, third trimester: Secondary | ICD-10-CM | POA: Diagnosis present

## 2020-08-16 DIAGNOSIS — O99824 Streptococcus B carrier state complicating childbirth: Secondary | ICD-10-CM | POA: Diagnosis present

## 2020-08-16 DIAGNOSIS — O9832 Other infections with a predominantly sexual mode of transmission complicating childbirth: Secondary | ICD-10-CM | POA: Diagnosis present

## 2020-08-16 LAB — CBC
HCT: 37.7 % (ref 36.0–46.0)
Hemoglobin: 12.9 g/dL (ref 12.0–15.0)
MCH: 31.7 pg (ref 26.0–34.0)
MCHC: 34.2 g/dL (ref 30.0–36.0)
MCV: 92.6 fL (ref 80.0–100.0)
Platelets: 205 10*3/uL (ref 150–400)
RBC: 4.07 MIL/uL (ref 3.87–5.11)
RDW: 13.3 % (ref 11.5–15.5)
WBC: 8.8 10*3/uL (ref 4.0–10.5)
nRBC: 0 % (ref 0.0–0.2)

## 2020-08-16 LAB — OB RESULTS CONSOLE GBS: GBS: NEGATIVE

## 2020-08-16 LAB — TYPE AND SCREEN
ABO/RH(D): O POS
Antibody Screen: NEGATIVE

## 2020-08-16 LAB — POCT FERN TEST: POCT Fern Test: POSITIVE

## 2020-08-16 LAB — RESP PANEL BY RT-PCR (FLU A&B, COVID) ARPGX2
Influenza A by PCR: NEGATIVE
Influenza B by PCR: NEGATIVE
SARS Coronavirus 2 by RT PCR: NEGATIVE

## 2020-08-16 MED ORDER — OXYCODONE-ACETAMINOPHEN 5-325 MG PO TABS: 1.0000 | ORAL_TABLET | ORAL | Status: DC | PRN

## 2020-08-16 MED ORDER — BUTORPHANOL TARTRATE 1 MG/ML IJ SOLN: 1.0000 mg | INTRAMUSCULAR | Status: DC | PRN

## 2020-08-16 MED ORDER — LACTATED RINGERS IV SOLN: INTRAVENOUS | Status: DC

## 2020-08-16 MED ORDER — SOD CITRATE-CITRIC ACID 500-334 MG/5ML PO SOLN: 30.0000 mL | ORAL | Status: DC | PRN

## 2020-08-16 MED ORDER — LIDOCAINE HCL (PF) 1 % IJ SOLN
30.0000 mL | INTRAMUSCULAR | Status: DC | PRN
  Administered 2020-08-17: 30 mL via SUBCUTANEOUS
  Filled 2020-08-16: qty 30

## 2020-08-16 MED ORDER — ACETAMINOPHEN 325 MG PO TABS: 650.0000 mg | ORAL_TABLET | ORAL | Status: DC | PRN

## 2020-08-16 MED ORDER — LACTATED RINGERS IV SOLN: 500.0000 mL | INTRAVENOUS | Status: DC | PRN

## 2020-08-16 MED ORDER — OXYTOCIN-SODIUM CHLORIDE 30-0.9 UT/500ML-% IV SOLN
2.5000 [IU]/h | INTRAVENOUS | Status: DC
  Administered 2020-08-17: 2.5 [IU]/h via INTRAVENOUS
  Filled 2020-08-16: qty 500

## 2020-08-16 MED ORDER — CEFAZOLIN SODIUM-DEXTROSE 2-4 GM/100ML-% IV SOLN
2.0000 g | Freq: Once | INTRAVENOUS | Status: AC
  Administered 2020-08-16: 2 g via INTRAVENOUS
  Filled 2020-08-16: qty 100

## 2020-08-16 MED ORDER — CEFAZOLIN SODIUM-DEXTROSE 1-4 GM/50ML-% IV SOLN
1.0000 g | Freq: Three times a day (TID) | INTRAVENOUS | Status: DC
  Administered 2020-08-16: 1 g via INTRAVENOUS
  Filled 2020-08-16 (×3): qty 50

## 2020-08-16 MED ORDER — ONDANSETRON HCL 4 MG/2ML IJ SOLN: 4.0000 mg | Freq: Four times a day (QID) | INTRAMUSCULAR | Status: DC | PRN

## 2020-08-16 MED ORDER — OXYCODONE-ACETAMINOPHEN 5-325 MG PO TABS
2.0000 | ORAL_TABLET | ORAL | Status: DC | PRN
Start: 2020-08-16 — End: 2020-08-17

## 2020-08-16 MED ORDER — OXYTOCIN BOLUS FROM INFUSION
333.0000 mL | Freq: Once | INTRAVENOUS | Status: AC
  Administered 2020-08-17: 333 mL via INTRAVENOUS

## 2020-08-16 MED ORDER — FLEET ENEMA 7-19 GM/118ML RE ENEM: 1.0000 | ENEMA | RECTAL | Status: DC | PRN

## 2020-08-16 NOTE — Progress Notes (Signed)
Report given to C. Barbarann Ehlers, RN, L&D Press photographer.  Room asignment given, 211.

## 2020-08-16 NOTE — H&P (Signed)
Abigail Palmer is a 25 y.o. female, G2 P1001, EGA 40+ weeks with EDC 5-1 presenting for ctx.  In MAU, irreg ctx, VE 3 cm, had SROM clear with + fern, has continued to have ctx.  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  OB History    Gravida  2   Para  1   Term  1   Preterm      AB      Living  1     SAB      IAB      Ectopic      Multiple  0   Live Births  1          Past Medical History:  Diagnosis Date  . Medical history non-contributory    Past Surgical History:  Procedure Laterality Date  . WISDOM TOOTH EXTRACTION  2020   Family History: family history is not on file. Social History:  reports that she has never smoked. She has never used smokeless tobacco. She reports previous alcohol use. She reports that she does not use drugs.     Maternal Diabetes: No Genetic Screening: Normal Maternal Ultrasounds/Referrals: Normal Fetal Ultrasounds or other Referrals:  None Maternal Substance Abuse:  No Significant Maternal Medications:  None Significant Maternal Lab Results:  Group B Strep positive Other Comments:  Fetus initially had urinary tract dilation, u/s with MFM with normal kidney and bladder  Review of Systems  Respiratory: Negative.   Cardiovascular: Negative.    Maternal Medical History:  Reason for admission: Rupture of membranes and contractions.   Contractions: Frequency: irregular.   Perceived severity is moderate.    Fetal activity: Perceived fetal activity is normal.    Prenatal Complications - Diabetes: none.   VE-5/70/-1, not 7 cm, vtx Dilation: 7 Effacement (%): 70 Station: -1 Exam by:: Leilanie Rauda Blood pressure 117/74, pulse 77, temperature 98.1 F (36.7 C), temperature source Oral, resp. rate 19, height 5' 3.5" (1.613 m), weight 98.2 kg, last menstrual period 11/06/2019, SpO2 99 %, unknown if currently breastfeeding. Maternal Exam:   Uterine Assessment: Contraction strength is moderate.  Contraction frequency is irregular.   Abdomen: Patient reports no abdominal tenderness. Estimated fetal weight is 7 lbs.   Fetal presentation: vertex  Introitus: Normal vulva. Normal vagina.  Ferning test: positive.  Amniotic fluid character: clear.  Pelvis: adequate for delivery.      Fetal Exam Fetal Monitor Review: Mode: ultrasound.   Baseline rate: 140.  Variability: moderate (6-25 bpm).   Pattern: accelerations present and no decelerations.    Fetal State Assessment: Category I - tracings are normal.     Physical Exam Vitals reviewed.  Constitutional:      Appearance: Normal appearance.  Cardiovascular:     Rate and Rhythm: Normal rate and regular rhythm.  Pulmonary:     Effort: Pulmonary effort is normal. No respiratory distress.  Abdominal:     Palpations: Abdomen is soft.  Genitourinary:    General: Normal vulva.  Neurological:     Mental Status: She is alert.     Prenatal labs: ABO, Rh: --/--/O POS (05/05 1230) Antibody: NEG (05/05 1230) Rubella:  immune RPR:   NR HBsAg:   neg HIV:   NR GBS:   pos  Assessment/Plan: IUP at 40+ weeks in latent labor with SROM, +GBS with PCN allergy.  She declines labor augmentation at this time, will monitor progress, anticipate SVD, on Ancef  for GBS prophylaxis   Leighton Roach Rocky Rishel 08/16/2020, 5:17 PM

## 2020-08-16 NOTE — MAU Note (Addendum)
Presents with ctxs every 8 minutes since 0600 this morning.  States been losing mucus plug since last week, had some blood tinged discharge this morning.  Unsure if LOF.  Endorses +FM.  Scheduled for IOL on 08/25/2020, prefers to labor naturally.

## 2020-08-17 ENCOUNTER — Other Ambulatory Visit (HOSPITAL_COMMUNITY): Payer: Medicaid Other

## 2020-08-17 ENCOUNTER — Encounter (HOSPITAL_COMMUNITY): Payer: Self-pay | Admitting: Obstetrics and Gynecology

## 2020-08-17 LAB — RPR: RPR Ser Ql: NONREACTIVE

## 2020-08-17 MED ORDER — SENNOSIDES-DOCUSATE SODIUM 8.6-50 MG PO TABS
2.0000 | ORAL_TABLET | Freq: Every day | ORAL | Status: DC
  Administered 2020-08-17 – 2020-08-18 (×2): 2 via ORAL
  Filled 2020-08-17: qty 2

## 2020-08-17 MED ORDER — BENZOCAINE-MENTHOL 20-0.5 % EX AERO
1.0000 "application " | INHALATION_SPRAY | CUTANEOUS | Status: DC | PRN
  Administered 2020-08-17: 1 via TOPICAL
  Filled 2020-08-17: qty 56

## 2020-08-17 MED ORDER — MAGNESIUM HYDROXIDE 400 MG/5ML PO SUSP: 30.0000 mL | ORAL | Status: DC | PRN

## 2020-08-17 MED ORDER — DIBUCAINE (PERIANAL) 1 % EX OINT: 1.0000 "application " | TOPICAL_OINTMENT | CUTANEOUS | Status: DC | PRN

## 2020-08-17 MED ORDER — OXYCODONE HCL 5 MG PO TABS: 10.0000 mg | ORAL_TABLET | ORAL | Status: DC | PRN

## 2020-08-17 MED ORDER — COCONUT OIL OIL: 1.0000 "application " | TOPICAL_OIL | Status: DC | PRN

## 2020-08-17 MED ORDER — ACETAMINOPHEN 325 MG PO TABS
650.0000 mg | ORAL_TABLET | ORAL | Status: DC | PRN
  Administered 2020-08-17 (×3): 650 mg via ORAL
  Filled 2020-08-17 (×3): qty 2

## 2020-08-17 MED ORDER — TETANUS-DIPHTH-ACELL PERTUSSIS 5-2.5-18.5 LF-MCG/0.5 IM SUSY: 0.5000 mL | PREFILLED_SYRINGE | Freq: Once | INTRAMUSCULAR | Status: DC

## 2020-08-17 MED ORDER — DIPHENHYDRAMINE HCL 25 MG PO CAPS: 25.0000 mg | ORAL_CAPSULE | Freq: Four times a day (QID) | ORAL | Status: DC | PRN

## 2020-08-17 MED ORDER — ZOLPIDEM TARTRATE 5 MG PO TABS: 5.0000 mg | ORAL_TABLET | Freq: Every evening | ORAL | Status: DC | PRN

## 2020-08-17 MED ORDER — MEASLES, MUMPS & RUBELLA VAC IJ SOLR: 0.5000 mL | Freq: Once | INTRAMUSCULAR | Status: DC

## 2020-08-17 MED ORDER — METHYLERGONOVINE MALEATE 0.2 MG/ML IJ SOLN: 0.2000 mg | INTRAMUSCULAR | Status: DC | PRN

## 2020-08-17 MED ORDER — ONDANSETRON HCL 4 MG/2ML IJ SOLN: 4.0000 mg | INTRAMUSCULAR | Status: DC | PRN

## 2020-08-17 MED ORDER — OXYCODONE HCL 5 MG PO TABS: 5.0000 mg | ORAL_TABLET | ORAL | Status: DC | PRN

## 2020-08-17 MED ORDER — METHYLERGONOVINE MALEATE 0.2 MG PO TABS
0.2000 mg | ORAL_TABLET | ORAL | Status: DC | PRN
Start: 2020-08-17 — End: 2020-08-18

## 2020-08-17 MED ORDER — IBUPROFEN 600 MG PO TABS
600.0000 mg | ORAL_TABLET | Freq: Four times a day (QID) | ORAL | Status: DC
  Administered 2020-08-17 – 2020-08-18 (×6): 600 mg via ORAL
  Filled 2020-08-17 (×6): qty 1

## 2020-08-17 MED ORDER — WITCH HAZEL-GLYCERIN EX PADS: 1.0000 "application " | MEDICATED_PAD | CUTANEOUS | Status: DC | PRN

## 2020-08-17 MED ORDER — SIMETHICONE 80 MG PO CHEW
80.0000 mg | CHEWABLE_TABLET | ORAL | Status: DC | PRN
  Filled 2020-08-17: qty 1

## 2020-08-17 MED ORDER — PRENATAL MULTIVITAMIN CH
1.0000 | ORAL_TABLET | Freq: Every day | ORAL | Status: DC
  Administered 2020-08-18: 1 via ORAL
  Filled 2020-08-17 (×2): qty 1

## 2020-08-17 MED ORDER — SENNOSIDES-DOCUSATE SODIUM 8.6-50 MG PO TABS
2.0000 | ORAL_TABLET | Freq: Every day | ORAL | Status: DC
  Filled 2020-08-17: qty 2

## 2020-08-17 MED ORDER — ONDANSETRON HCL 4 MG PO TABS: 4.0000 mg | ORAL_TABLET | ORAL | Status: DC | PRN

## 2020-08-17 NOTE — Lactation Note (Signed)
This note was copied from a baby's chart. Lactation Consultation Note  Patient Name: Abigail Palmer [id number] Date: 08/17/2020 Reason for consult: L&D Initial assessment;Term Age:26 hours P2, term female infant seen in L&D. LC entered room, mom was doing STS and infant was cuing to breastfeed. Mom latched infant on her left breast using the football hold position, infant would open mouth wide but not sustain latch was off and on breast, LC assisted mom in using tea cup hold when infant was latched.  Mom knows how to hand express and infant was given 5 mls of colostrum by spoon. Mom will continue to work towards infant sustaining latch while feeding. Mom knows to breastfeed infant according to primal cues: licking, tasting, rooting, kissing or licking breast and doing STS while infant is breastfeeding. LC discussed infant's input and output. Mom knows to ask RN or LC for further assistance with latching infant at the breast if needed.  Maternal Data Has patient been taught Hand Expression?: Yes Does the patient have breastfeeding experience prior to this delivery?: Yes How long did the patient breastfeed?: Per mom, she breastfeed her two year old son for 18 months  Feeding Mother's Current Feeding Choice: Breast Milk  LATCH Score Latch: Repeated attempts needed to sustain latch, nipple held in mouth throughout feeding, stimulation needed to elicit sucking reflex.  Audible Swallowing: A few with stimulation  Type of Nipple: Everted at rest and after stimulation  Comfort (Breast/Nipple): Soft / non-tender  Hold (Positioning): Assistance needed to correctly position infant at breast and maintain latch.  LATCH Score: 7   Lactation Tools Discussed/Used    Interventions Interventions: Breast feeding basics reviewed;Assisted with latch;Skin to skin;Hand express;Adjust position;Breast compression;Support pillows;Position options;Expressed milk;Education  Discharge    Consult  Status Consult Status: Follow-up Date: 08/17/20 Follow-up type: In-patient    Danelle Earthly 08/17/2020, 1:58 AM

## 2020-08-17 NOTE — Lactation Note (Signed)
This note was copied from a baby's chart. Lactation Consultation Note  Patient Name: Abigail Palmer [id number] Date: 08/17/2020 Reason for consult: Follow-up assessment;Term Age:27 hours   P2 mother whose infant is now 31 hours old.  This is a term baby at 40+5 weeks.  Mother requested latch assistance.  Baby asleep at mother's side when I arrived.  Mother interested in trying to latch even though baby is not showing cues.  Asked permission to remove baby's clothing and discussed feeding STS.  Mother demonstrated hand expression and was able to easily express colostrum.  She obtained 3 mls which I spoon fed to baby.   Assisted to latch to the left breast easily, however, baby was too sleepy to initiate more than a few sucks.  Demonstrated breast compressions and gentle stimulation.  Suggested mother hold STS and continue to observe for feeding cues.  Mother will call as needed for latch assistance.  Mother has a DEBP for home use.  Support person present.     Maternal Data Has patient been taught Hand Expression?: Yes Does the patient have breastfeeding experience prior to this delivery?: Yes How long did the patient breastfeed?: 18 months  Feeding Mother's Current Feeding Choice: Breast Milk  LATCH Score Latch: Repeated attempts needed to sustain latch, nipple held in mouth throughout feeding, stimulation needed to elicit sucking reflex.  Audible Swallowing: None  Type of Nipple: Everted at rest and after stimulation  Comfort (Breast/Nipple): Soft / non-tender  Hold (Positioning): Assistance needed to correctly position infant at breast and maintain latch.  LATCH Score: 6   Lactation Tools Discussed/Used    Interventions Interventions: Breast feeding basics reviewed;Assisted with latch;Skin to skin;Hand express;Breast compression;Adjust position;Position options;Support pillows;Education  Discharge Pump: Manual;Personal  Consult Status Consult Status: Follow-up Date:  08/18/20 Follow-up type: In-patient    Suheyb Raucci R Dazia Lippold 08/17/2020, 7:43 AM

## 2020-08-17 NOTE — Lactation Note (Signed)
This note was copied from a baby's chart. Lactation Consultation Note  Patient Name: Abigail Palmer [id number] Date: 08/17/2020 Reason for consult: Follow-up assessment;Mother's request Age:27 hours  LC in to room per mother's request. Mother reports infant just took ~60mL of EBM spoonfed. Mother states infant can not latch because pushed nipple out with tongue.  LC offered assistance with latch. Changed stool diaper. LC noted infant keeps tongue up close to palate.  Mother hand expresses and colostrum is abundant. Several attempts to latch modified cradle position to right breast. Infant holds nipple in mouth and/or licks colostrum. No suckling reflex elicited. Talked about potentially using a nipple shield. Mother explains she used a nipple shield with her son.  Infant is not displaying hunger cues that seems to provide reassurance.  Encouraged to contact LC as needed.   Feeding Mother's Current Feeding Choice: Breast Milk  LATCH Score Latch: Too sleepy or reluctant, no latch achieved, no sucking elicited.  Audible Swallowing: None  Type of Nipple: Everted at rest and after stimulation (short shafted)  Comfort (Breast/Nipple): Soft / non-tender  Hold (Positioning): Assistance needed to correctly position infant at breast and maintain latch.  LATCH Score: 5   Interventions Interventions: Breast feeding basics reviewed;Assisted with latch;Skin to skin;Adjust position;Support pillows;Position options;Expressed milk   Consult Status Consult Status: Follow-up Date: 08/17/20 Follow-up type: In-patient    [name] A Higuera Ancidey 08/17/2020, 6:00 PM

## 2020-08-18 MED ORDER — IBUPROFEN 800 MG PO TABS: 800.0000 mg | ORAL_TABLET | Freq: Three times a day (TID) | ORAL | 1 refills | Status: AC | PRN

## 2020-08-18 NOTE — Lactation Note (Signed)
This note was copied from a baby's chart. Lactation Consultation Note  Patient Name: Abigail Palmer [id number] Date: 08/18/2020 Reason for consult: Follow-up assessment;Mother's request;Term;Difficult latch;Infant weight loss Age:27 hours  Visited with mom of 37 hours old FT female, she's a P2 and reported a Hx of oversupply with her first baby, she had a buy a deep freezer to store all her breastmilk. Mom and baby are going home today, reviewed discharge education, pumping schedule, cluster feeding, feeding cues, size of baby's stomach and lactogenesis II.  LC also assisted mom with pre-pumping and NS #24 placement, baby still having some difficulties latching on but mom has great supply. Offered assistance with latch, but mom politely declined, she told LC baby already fed and she didn't want to wake her up. Mom would like to have someone observed a feeding using the NS # 24 prior her discharge. Asked mom to call for assistance on the next feeding, her LC/RN will assist her.   Feeding plan:  1. Encouraged mom to continue feeding baby STS 8-12 times/24 hours or sooner if feeding cues are present using NS # 24 PRN 2. Pumping prior feedings was also encouraged to help evert the nipple prior NS placement. Mom doesn't feel like pumping after feedings at the breast due to Hx of oversupply 3. She' already getting EBM just by pre-pumping, she'll continue spoon feeding baby any amount of EBM she may get  GOB present and supportive. Family reported all questions and concerns were answered, they're both aware of LC OP services and will call PRN.   Maternal Data    Feeding Mother's Current Feeding Choice: Breast Milk  LATCH Score   Lactation Tools Discussed/Used Tools: Pump;Nipple Shields Nipple shield size: 24 Breast pump type: Double-Electric Breast Pump;Manual Pump Education: Setup, frequency, and cleaning;Milk Storage Reason for Pumping: flat nipples Pumping frequency: pre-pumping Pumped  volume: 10 mL  Interventions Interventions: Breast feeding basics reviewed;Breast massage;Hand express;DEBP;Hand pump;Education  Discharge Discharge Education: Engorgement and breast care;Warning signs for feeding baby;Outpatient recommendation (she had a Hx of oversupply) Pump: DEBP;Manual;Personal ([employer]ela DEBP at home)  Consult Status Consult Status: Complete (mom will call to observe a latch with NS #24) Date: 08/18/20 Follow-up type: Call as needed    Zakaiya Lares S Philis Nettle 08/18/2020, 1:55 PM

## 2020-08-18 NOTE — Discharge Summary (Signed)
Postpartum Discharge Summary  Date of Service updated     Patient Name: Abigail Palmer DOB: April 15, 1995 MRN: [id number]  Date of admission: 08/16/2020 Delivery date:08/17/2020  Delivering provider: Jackelyn Knife, TODD  Date of discharge: 08/18/2020  Admitting diagnosis: Normal labor [O80, Z37.9] Intrauterine pregnancy: [redacted]w[redacted]d     Secondary diagnosis:  Active Problems:   Normal labor  Additional problems: VAVD    Discharge diagnosis: Term Pregnancy Delivered                                              Post partum procedures:none Augmentation: N/A Complications: None  Hospital course: Onset of Labor With Vaginal Delivery      25 y.o. yo [id number] at [redacted]w[redacted]d was admitted in Active Labor on 08/16/2020. Patient had an uncomplicated labor course as follows:  Membrane Rupture Time/Date: 12:00 PM ,08/16/2020   Delivery Method:VBAC, Vacuum Assisted FHR decels and OP presentation Episiotomy: None  Lacerations:  2nd degree  Patient had an uncomplicated postpartum course.  She is ambulating, tolerating a regular diet, passing flatus, and urinating well. Patient is discharged home in stable condition on 08/18/20.  Newborn Data: Birth date:08/17/2020  Birth time:12:05 AM  Gender:Female  Living status:Living  Apgars:8 ,9  Weight:3110 g   Physical exam  Vitals:   08/17/20 1300 08/17/20 1648 08/17/20 2114 08/18/20 0605  BP: 115/75 109/76 107/69 103/66  Pulse: 81 (!) 103 88 78  Resp: 16 18 18 18   Temp: 98.2 F (36.8 C) 97.9 F (36.6 C) 97.7 F (36.5 C) 98.1 F (36.7 C)  TempSrc: Oral Oral Oral Oral  SpO2:   100% 100%  Weight:      Height:       General: alert, cooperative and no distress Lochia: appropriate Uterine Fundus: firm Incision: Healing well with no significant drainage, No significant erythema DVT Evaluation: No evidence of DVT seen on physical exam. Negative Homan's sign. No cords or calf tenderness. Labs: Lab Results  Component Value Date   WBC 8.8 08/16/2020   HGB 12.9  08/16/2020   HCT 37.7 08/16/2020   MCV 92.6 08/16/2020   PLT 205 08/16/2020   No flowsheet data found. Edinburgh Score: Edinburgh Postnatal Depression Scale Screening Tool 08/17/2020  I have been able to laugh and see the funny side of things. 0  I have looked forward with enjoyment to things. 0  I have blamed myself unnecessarily when things went wrong. 0  I have been anxious or worried for no good reason. 0  I have felt scared or panicky for no good reason. 0  Things have been getting on top of me. 0  I have been so unhappy that I have had difficulty sleeping. 0  I have felt sad or miserable. 0  I have been so unhappy that I have been crying. 0  The thought of harming myself has occurred to me. 0  Edinburgh Postnatal Depression Scale Total 0      After visit meds:  Allergies as of 08/18/2020      Reactions   Penicillins    As a child. Unsure reaction      Medication List    STOP taking these medications   acetaminophen 325 MG tablet Commonly known as: Tylenol   VALTREX PO     TAKE these medications   docusate sodium 100 MG capsule Commonly known as: Colace Take  1 capsule (100 mg total) by mouth 2 (two) times daily as needed for mild constipation.   ibuprofen 800 MG tablet Commonly known as: ADVIL Take 1 tablet (800 mg total) by mouth every 8 (eight) hours as needed.   prenatal multivitamin Tabs tablet Take 1 tablet by mouth daily at 12 noon.        Discharge home in stable condition Infant Feeding: Breast Infant Disposition:home with mother Discharge instruction: per After Visit Summary and Postpartum booklet. Activity: Advance as tolerated. Pelvic rest for 6 weeks.  Diet: routine diet Anticipated Birth Control: patch Postpartum Appointment:6 weeks Future Appointments: Future Appointments  Date Time Provider Department Center  08/23/2020 10:00 AM [facility] None   Follow up Visit:      08/18/2020 Carlisle Cater, MD

## 2020-08-18 NOTE — Progress Notes (Signed)
POSTPARTUM PROGRESS NOTE  Post Partum Day #1  Subjective:  No acute events overnight.  Pt denies problems with ambulating, voiding or po intake.  She denies nausea or vomiting.  Pain is well controlled.   Lochia Minimal.   Objective: Blood pressure 103/66, pulse 78, temperature 98.1 F (36.7 C), temperature source Oral, resp. rate 18, height 5' 3.5" (1.613 m), weight 98.2 kg, last menstrual period 11/06/2019, SpO2 100 %, unknown if currently breastfeeding.  Physical Exam:  General: alert, cooperative and no distress Lochia:normal flow Chest: CTAB Heart: RRR no m/r/g Abdomen: +BS, soft, nontender Uterine Fundus: firm, 2cm below umbilicus GU: suture intact, healing well, no purulent drainage Extremities: neg edema, neg calf TTP BL, neg Homans BL  Recent Labs    08/16/20 1233  HGB 12.9  HCT 37.7    Assessment/Plan:  ASSESSMENT: Abigail Palmer is a 25 y.o. Y0V3710 s/p VAVD for FHR decels and persistent OP presentation @ [redacted]w[redacted]d. 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   Discharge home, Breastfeeding and Contraception planning on patch  Will hold DC order until baby fully cleared by peds for discharge   LOS: 2 days

## 2020-08-20 ENCOUNTER — Inpatient Hospital Stay (HOSPITAL_COMMUNITY)
Admission: AD | Admit: 2020-08-20 | Payer: Medicaid Other | Source: Home / Self Care | Admitting: Obstetrics and Gynecology

## 2020-08-20 ENCOUNTER — Inpatient Hospital Stay (HOSPITAL_COMMUNITY): Payer: Medicaid Other

## 2020-08-23 ENCOUNTER — Other Ambulatory Visit (HOSPITAL_COMMUNITY): Payer: Medicaid Other | Attending: Obstetrics and Gynecology

## 2020-08-25 ENCOUNTER — Inpatient Hospital Stay (HOSPITAL_COMMUNITY): Payer: Medicaid Other

## 2020-08-25 ENCOUNTER — Inpatient Hospital Stay (HOSPITAL_COMMUNITY)
Admission: AD | Admit: 2020-08-25 | Payer: Medicaid Other | Source: Home / Self Care | Admitting: Obstetrics and Gynecology

## 2021-01-03 ENCOUNTER — Ambulatory Visit: Payer: Medicaid Other | Admitting: Physical Therapy

## 2021-10-09 IMAGING — US US MFM OB FOLLOW-UP
1 series · 13 of 28 positions shown · non-contrast
Comparison: none

[Series 1: us mfm ob follow-up · 13 of 55 slices shown]
[im 3/55]
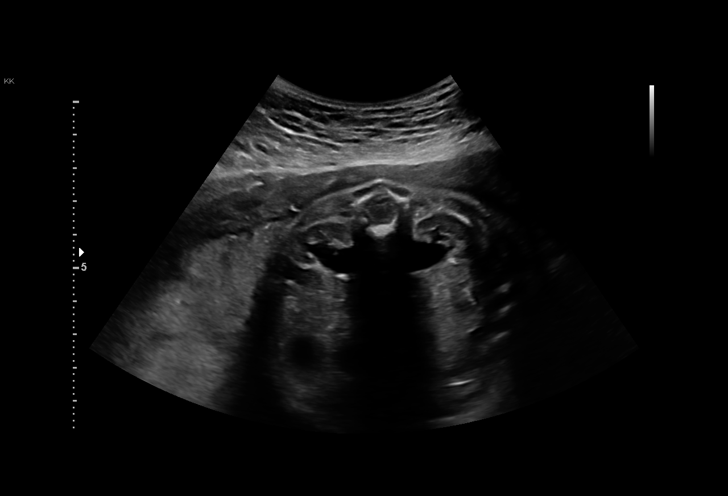
[im 7/55]
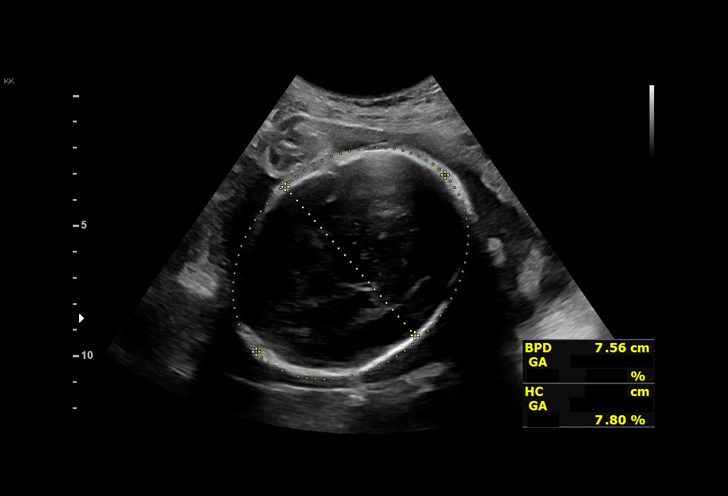
[im 11/55]
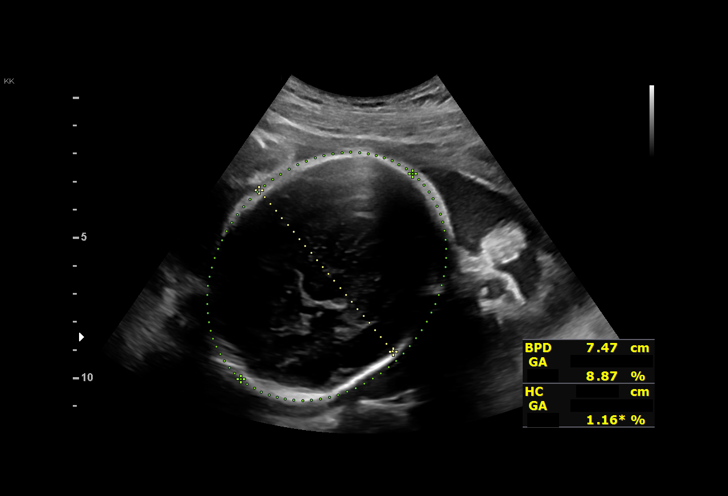
[im 15/55]
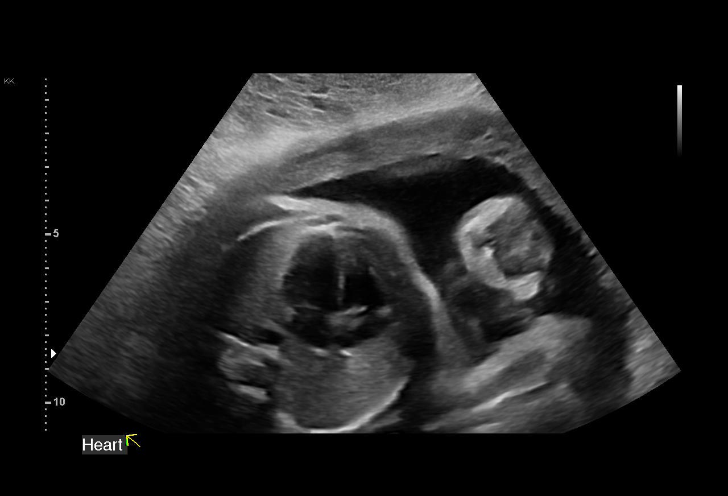
[im 19/55]
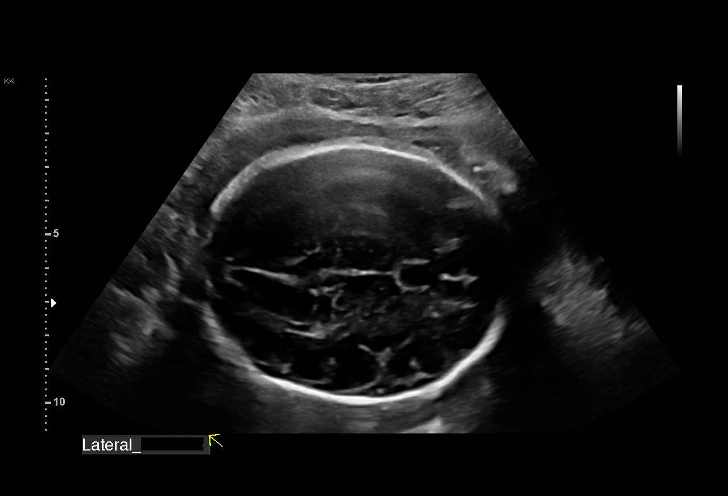
[im 23/55]
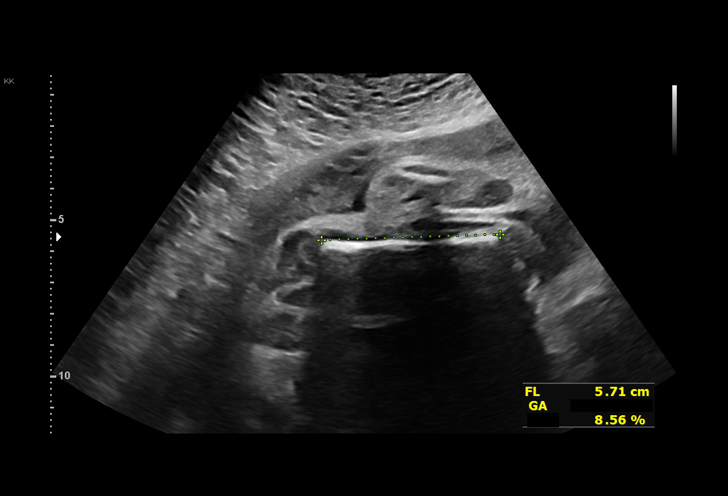
[im 29/55]
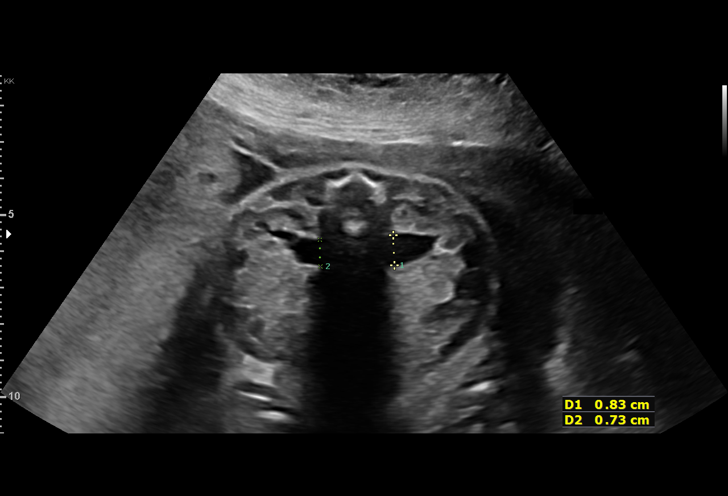
[im 33/55]
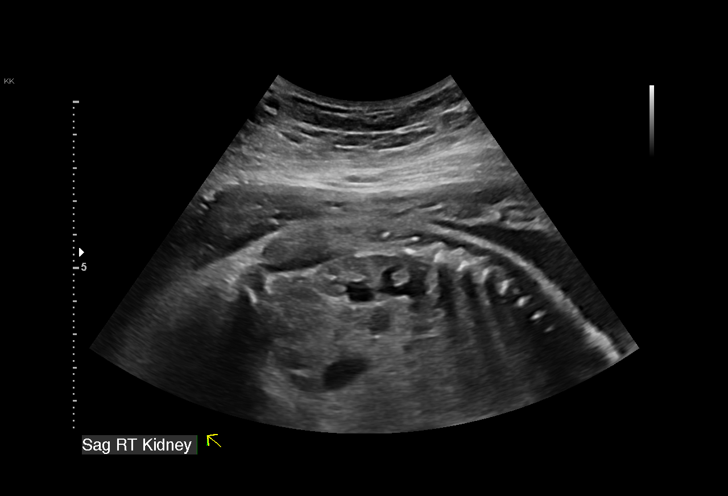
[im 37/55]
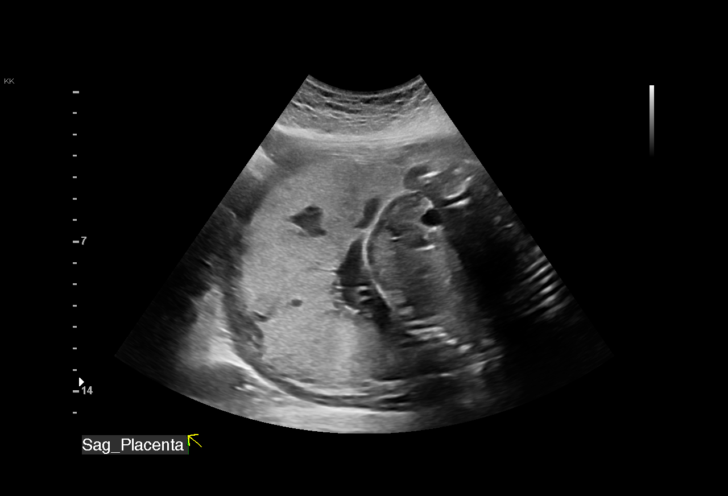
[im 41/55]
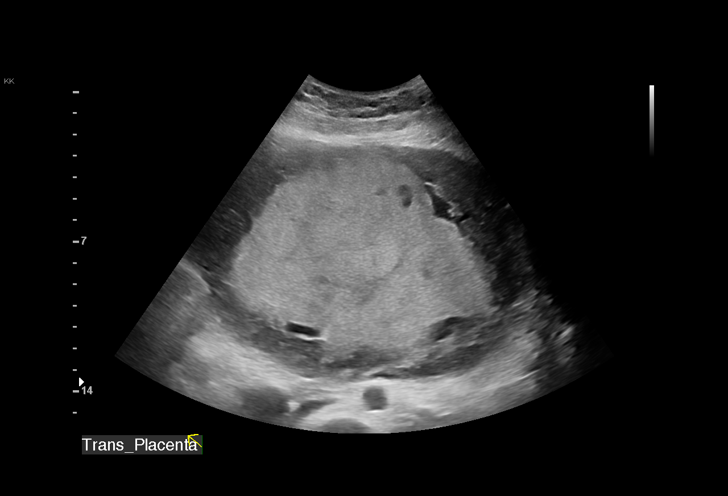
[im 45/55]
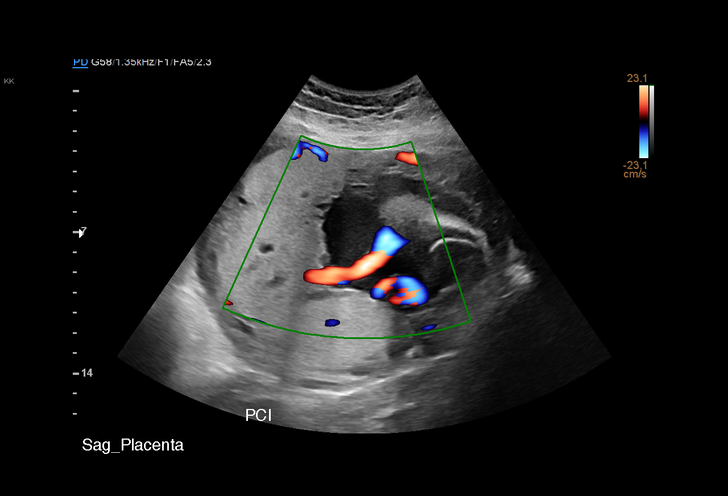
[im 49/55]
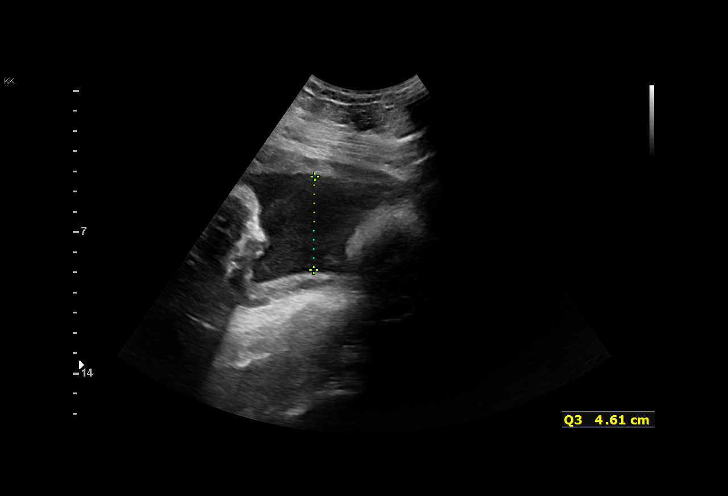
[im 53/55]
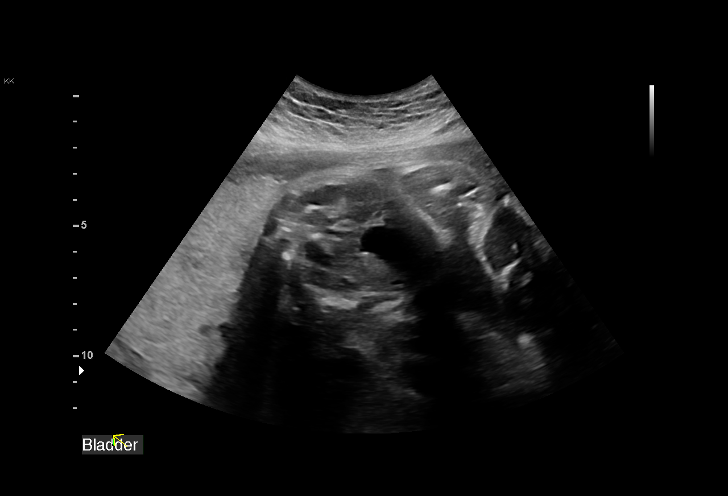

[13 of 28 positions shown; findings below may reference images not displayed]

#101

Indications

 Encounter for other antenatal screening
 follow-up
 Pyelectasis of fetus on prenatal ultrasound
 31 weeks gestation of pregnancy
 Small for gestational age fetus affecting
 management of mother
Fetal Evaluation

 Num Of Fetuses:         1
 Fetal Heart Rate(bpm):  158
 Cardiac Activity:       Observed
 Presentation:           Cephalic
 Placenta:               Posterior
 P. Cord Insertion:      Previously Visualized

 Amniotic Fluid
 AFI FV:      Within normal limits

 AFI Sum(cm)     %Tile       Largest Pocket(cm)
 17.3            64

 RUQ(cm)       RLQ(cm)       LUQ(cm)        LLQ(cm)

Biometry

 BPD:      74.9  mm     G. Age:  30w 0d         10  %    CI:        76.21   %    70 - 86
                                                         FL/HC:      21.1   %    19.3 -
 HC:      271.9  mm     G. Age:  29w 5d        < 1  %    HC/AC:      1.02        0.96 -
 AC:      266.8  mm     G. Age:  30w 6d         32  %    FL/BPD:     76.8   %    71 - 87
 FL:       57.5  mm     G. Age:  30w 1d         11  %    FL/AC:      21.6   %    20 - 24

 Est. FW:    5256  gm      3 lb 7 oz     15  %
OB History

 Gravidity:    2         Term:   1        Prem:   0        SAB:   0
 TOP:          0       Ectopic:  0        Living: 1
Gestational Age

 LMP:           31w 2d        Date:  11/06/19                 EDD:   08/12/20
 U/S Today:     30w 1d                                        EDD:   08/20/20
 Best:          31w 2d     Det. By:  LMP  (11/06/19)          EDD:   08/12/20
Anatomy

 Cranium:               Appears normal         LVOT:                   Previously seen
 Cavum:                 Previously seen        Aortic Arch:            Previously seen
 Ventricles:            Appears normal         Ductal Arch:            Previously seen
 Choroid Plexus:        Previously seen        Diaphragm:              Appears normal
 Cerebellum:            Previously seen        Stomach:                Appears normal, left
                                                                       sided
 Posterior Fossa:       Previously seen        Abdomen:                Appears normal
 Nuchal Fold:           Previously seen        Abdominal Wall:         Previously seen
 Face:                  Orbits and profile     Cord Vessels:           Previously seen
                        previously seen
 Lips:                  Previously seen        Kidneys:                Bilat UTD Rt 7mm,
                                                                       Lt 8mm
 Palate:                Appears normal         Bladder:                Appears normal
 Thoracic:              Appears normal         Spine:                  Previously seen
 Heart:                 Appears normal         Upper Extremities:      Previously seen
                        (4CH, axis, and
                        situs)
 RVOT:                  Previously seen        Lower Extremities:      Previously seen

 Other:  Fetus appears to be female. SVC, IVC, 3VV/T, Heels, 5th digit, Nasal
         bone, and Open hands previously visualized.
Cervix Uterus Adnexa

 Cervix
 Not visualized (advanced GA >01wks)
Impression

 Patient returns for fetal growth assessment.  On previous
 ultrasound, the estimated fetal weight was at the 10th
 percentile.
 Fetal growth is appropriate for gestational age.the estimated
 fetal weight is at the 15th percentile.  The head circumference
 measurement is at between -2 and -1 SD (normal).  Amniotic
 fluid is normal and good fetal activity is seen .  Bilateral mild
 urinary tract dilations are seen.  Both kidneys, otherwise,
 appear normal.

 I recommend a follow-up fetal growth assessment in 3 weeks
 because of potential fetal growth restriction.
Recommendations

 -An appointment was made for her to return in 3 weeks for
 fetal growth assessment.
                 Aujla, Lesya

## 2023-03-18 ENCOUNTER — Ambulatory Visit (INDEPENDENT_AMBULATORY_CARE_PROVIDER_SITE_OTHER): Payer: No Typology Code available for payment source | Admitting: Allergy

## 2023-03-18 ENCOUNTER — Encounter: Payer: Self-pay | Admitting: Allergy

## 2023-03-18 VITALS — BP 120/82 | HR 86 | Temp 98.1°F | Resp 18 | Ht 64.0 in | Wt 188.4 lb

## 2023-03-18 DIAGNOSIS — J310 Chronic rhinitis: Secondary | ICD-10-CM | POA: Diagnosis not present

## 2023-03-18 DIAGNOSIS — T7800XD Anaphylactic reaction due to unspecified food, subsequent encounter: Secondary | ICD-10-CM

## 2023-03-18 DIAGNOSIS — Z880 Allergy status to penicillin: Secondary | ICD-10-CM | POA: Diagnosis not present

## 2023-03-18 NOTE — Patient Instructions (Addendum)
Nut Allergy History of reactions to nuts (peanuts and pistachio).  -Skin prick testing today is positive to peanut and pistachio -Order blood testing for nut allergens. -Consider food challenge based on test results. -Discussed recommendation for access to epinephrine device today with the patient.  We made the shared decision that she will continue to avoidance of nuts at this time.  Penicillin Allergy History of reaction in childhood.  At this time likelihood of continued allergy is extremely low -Recommend penicillin challenge in the future to confirm or rule out allergy.  Environmental Allergies Reports symptoms of allergic rhinitis. -Recommend use of a nasal spray such as Flonase or Nasacort for symptom relief.  Use 2 sprays each nostril daily for 1-2 weeks at a time before stopping once nasal congestion improves for maximum benefit. -Use long-acting antihistamine like Zyrtec, Allegra or Xyzal daily as needed  Follow-up pending results

## 2023-03-18 NOTE — Progress Notes (Signed)
New Patient Note  RE: Abigail Palmer MRN: [id number] DOB: 1995/10/26 Date of Office Visit: 03/18/2023  Primary care provider: Patient, No Pcp Per  Chief Complaint: Food allergy  History of present illness: Abigail Palmer is a 27 y.o. female presenting today for evaluation of food allergy.  Discussed the use of AI scribe software for clinical note transcription with the patient, who gave verbal consent to proceed.  The patient, seeking to join [employer], presents with a history of nut allergy. The patient's mother initially informed her of the allergy during childhood. The patient recalls two distinct episodes of allergic reactions. The first occurred around the age of 10-12 years, following the consumption of an M&M, which resulted in vomiting. The second episode occurred approximately six to seven years ago after ingesting a pistachio, mistaking it for a sunflower seed. This incident led to blurry vision, a sensation of throat closure, and vomiting. The patient managed the symptoms with Benadryl and rest. Despite the nut allergy, the patient regularly consumes food from [employer], which uses peanut oil, without any adverse reactions.  She states she has never had an epinephrine device and has been really good about avoidance.  At this time she is not interested in having an epinephrine device as she has done well to date with avoidance measures.  The patient also has a history of penicillin allergy, which was noted in childhood, but the nature of the reaction is uncertain. The patient has not taken penicillin for over 10+ years. Additionally, the patient has a history of asthma in childhood, which has since resolved, and a history of eczema, which also resolved in childhood. The patient also reports environmental allergies, managed intermittently with Zyrtec.   Review of systems: 10pt ROS negative unless noted above in HPI  All other systems negative unless noted above in HPI  Past medical  history: Past Medical History:  Diagnosis Date   Food allergy     Past surgical history: Past Surgical History:  Procedure Laterality Date   WISDOM TOOTH EXTRACTION  2020    Family history:  Family History  Problem Relation Age of Onset   Obesity Neg Hx    Hypertension Neg Hx    Diabetes Neg Hx    Cancer Neg Hx    Allergic rhinitis Neg Hx    Asthma Neg Hx    Eczema Neg Hx    Urticaria Neg Hx     Social history: Lives in an apartment with carpeting with electric heating and cooling.  No pets in the home.  There is no concern for water damage, mildew or roaches in home.  She is a case Production designer, theatre/television/film.  She denies smoking history.   Medication List: Current Outpatient Medications  Medication Sig Dispense Refill   HAILEY 24 FE 1-20 MG-MCG(24) tablet Take 1 tablet by mouth daily.     ibuprofen (ADVIL) 800 MG tablet Take 1 tablet (800 mg total) by mouth every 8 (eight) hours as needed. 60 tablet 1   Prenatal Vit-Fe Fumarate-FA (PRENATAL MULTIVITAMIN) TABS tablet Take 1 tablet by mouth daily at 12 noon.     docusate sodium (COLACE) 100 MG capsule Take 1 capsule (100 mg total) by mouth 2 (two) times daily as needed for mild constipation. (Patient not taking: Reported on 05/23/2020) 10 capsule 0   ELDERBERRY PO Take 1 tablet by mouth daily.     No current facility-administered medications for this visit.    Known medication allergies: Allergies  Allergen Reactions  Other Anaphylaxis   Penicillins     As a child. Unsure reaction     Physical examination: Blood pressure 120/82, pulse 86, temperature 98.1 F (36.7 C), temperature source Temporal, resp. rate 18, height 5\' 4"  (1.626 m), weight 188 lb 6.4 oz (85.5 kg), SpO2 100%, unknown if currently breastfeeding.  General: Alert, interactive, in no acute distress. HEENT: PERRLA, TMs pearly gray, turbinates moderately edematous without discharge, post-pharynx non erythematous. Neck: Supple without lymphadenopathy. Lungs: Clear to  auscultation without wheezing, rhonchi or rales. {no increased work of breathing. CV: Normal S1, S2 without murmurs. Abdomen: Nondistended, nontender. Skin: Warm and dry, without lesions or rashes. Extremities:  No clubbing, cyanosis or edema. Neuro:   Grossly intact.  Diagnositics/Labs:  Allergy testing:   Food Adult Perc - 03/18/23 1000     Time Antigen Placed 1040    Allergen Manufacturer Waynette Buttery    Location Arm    Number of allergen test 10     Control-buffer 50% Glycerol Negative    1. Peanut 3+   7x20   10. Cashew Negative    11. Walnut Food Negative    12. Almond Negative    13. Hazelnut Negative    14. Pecan Food Negative    15. Pistachio 3+   8x25   16. [place] Nut Negative             Allergy testing results were read and interpreted by provider, documented by clinical staff.   Assessment and plan: Nut Allergy History of reactions to nuts (peanuts and pistachio).  -Skin prick testing today is positive to peanut and pistachio -Order blood testing for nut allergens. -Consider food challenge based on test results. -Discussed recommendation for access to epinephrine device today with the patient.  We made the shared decision that she will continue to avoidance of nuts at this time.  Penicillin Allergy History of reaction in childhood.  At this time likelihood of continued allergy is extremely low -Recommend penicillin challenge in the future to confirm or rule out allergy.  Environmental Allergies Reports symptoms of allergic rhinitis. -Recommend use of a nasal spray such as Flonase or Nasacort for symptom relief.  Use 2 sprays each nostril daily for 1-2 weeks at a time before stopping once nasal congestion improves for maximum benefit. -Use long-acting antihistamine like Zyrtec, Allegra or Xyzal daily as needed  Follow-up pending results  I appreciate the opportunity to take part in Marieann's care. Please do not hesitate to contact me with  questions.  Sincerely,   Margo Aye, MD Allergy/Immunology [facility]

## 2023-03-21 LAB — PANEL 604726
Cor A 1 IgE: <0.1 kU/L
Cor A 14 IgE: <0.1 kU/L
Cor A 8 IgE: 0.1 kU/L — AB
Cor A 9 IgE: <0.1 kU/L

## 2023-03-21 LAB — IGE NUT PROF. W/COMPONENT RFLX
F017-IgE Hazelnut (Filbert): 0.14 kU/L — AB
F202-IgE Cashew Nut: 0.13 kU/L — AB
F202-IgE Cashew Nut: 1.05 kU/L — AB
F256-IgE Walnut: 0.39 kU/L — AB
Jug R 3 IgE: 1.01 kU/L — AB
Macadamia Nut, IgE: 0.2 kU/L — AB
Peanut, IgE: 0.41 kU/L — AB

## 2023-03-21 LAB — ALLERGEN COMPONENT COMMENTS

## 2023-03-21 LAB — PEANUT COMPONENTS
F352-IgE Ara h 8: <0.1 kU/L
F422-IgE Ara h 1: <0.1 kU/L
F423-IgE Ara h 2: 0.39 kU/L — AB
F424-IgE Ara h 3: <0.1 kU/L
F427-IgE Ara h 9: <0.1 kU/L
F447-IgE Ara h 6: 0.4 kU/L — AB

## 2023-03-21 LAB — PANEL 604239: ANA O 3 IgE: 1.71 kU/L — AB

## 2023-03-21 LAB — PANEL 604721
Jug R 1 IgE: 0.23 kU/L — AB
Jug R 3 IgE: 0.14 kU/L — AB

## 2023-03-24 ENCOUNTER — Telehealth: Payer: Self-pay | Admitting: Allergy

## 2023-03-24 NOTE — Telephone Encounter (Signed)
Called patient - DOB verified - advised message will be forwarded to provider to review lab results/make recommendations. Provider will then be able to generate a letter for the [employer].  Patient stated she eats [employer] all the time without having any reactions - her recruiter is concerned due to the [employer] food is cooked in peanut oil as well.   Patient is wanting provider to include in her letter that peanut oil is okay for patient due to how it is processed.  Patient advised once provider reviews all lab results/makes recommendation/ she will then generate a letter - she will be contacted either via her myChart or by our office.  Patient verbalized understanding to all, no further questions.

## 2023-03-24 NOTE — Telephone Encounter (Signed)
Patient asking for the nurse to call  her in reference to sending a letter to the miliary with a list of her allergies and  current medications

## 2023-03-26 NOTE — Telephone Encounter (Signed)
Patient called back today regarding her lab results and the message Marcelino Duster took.   Patient is fine with communicating via MyChart.

## 2023-03-27 ENCOUNTER — Encounter: Payer: Self-pay | Admitting: Allergy

## 2023-03-27 NOTE — Telephone Encounter (Signed)
Called patient - DOB/DPR verified - LMOVM advising the [employer] letter has been completed/signed by provider - ready for pick up. Patient advised to contact the office via mychart or calling advising whether she will pick the letter or request to have it mailed to residence.   I will send a mychart message to patient as well.  Letter has been placed in the Pending folder at the nurse's station in the back.

## 2023-03-27 NOTE — Telephone Encounter (Signed)
Letter has been printed and given to provider to sign.

## 2023-03-27 NOTE — Telephone Encounter (Signed)
-----   Message from [name] sent at 03/27/2023 11:28 AM EST ----- Regarding: Letter Please create into a letter and see how patient would like this to be delivered either by mail or pick up in office: -----------------------------------------------------------------  To whom it may concern:  Abigail Palmer is a patient of mine at the [facility] for primary management of food allergy.  She has a reported history of symptoms consistent with allergic reaction following peanut and tree nut ingestion.  Food allergy skin prick testing performed to nuts a positive wheal and flare for peanut and pistachio.  Serum IgE levels for nuts obtained via blood work shows detectable IgE to hazelnut, walnut, cashew, peanut, macadamia nut, pistachio and almond however they were relatively low.  Thus she has been recommended for in office food challenges to determine if these foods are now tolerated or not.  Until able to perform food challenge she is considered allergic to peanut and tree nuts.   She also has a history of penicillin allergy from childhood.  She has been recommended to undergo an in office penicillin challenge to see if this is no longer an allergy for her and can be cleared. She self-reports allergic rhinitis that is managed with antihistamine like Zyrtec.  In regards to refined peanut oil, this should not be an issue for her as most oils and how they are processed and produced do not typically cause allergic reaction symptoms.  I fully expect a person with a confirmed peanut allergy should be able to tolerate foods cooked in refined peanut oil as long as the food consumed is not a peanut product (ie. chicken fried in refined peanut oil should be tolerated).    Please have patient come in the office with further questions.  Sincerely,    Margo Aye, MD [facility]

## 2023-03-30 ENCOUNTER — Encounter: Payer: Self-pay | Admitting: *Deleted

## 2023-04-30 ENCOUNTER — Ambulatory Visit (INDEPENDENT_AMBULATORY_CARE_PROVIDER_SITE_OTHER): Payer: No Typology Code available for payment source | Admitting: Allergy

## 2023-04-30 ENCOUNTER — Other Ambulatory Visit: Payer: Self-pay

## 2023-04-30 ENCOUNTER — Encounter: Payer: Self-pay | Admitting: Allergy

## 2023-04-30 VITALS — BP 108/80 | HR 79 | Temp 98.0°F | Resp 16

## 2023-04-30 DIAGNOSIS — T7800XD Anaphylactic reaction due to unspecified food, subsequent encounter: Secondary | ICD-10-CM

## 2023-04-30 MED ORDER — CETIRIZINE HCL 10 MG PO TABS: 10.0000 mg | ORAL_TABLET | Freq: Once | ORAL | Status: AC

## 2023-04-30 NOTE — Progress Notes (Signed)
Follow-up Note  RE: Abigail Palmer MRN: [id number] DOB: 1995-05-31 Date of Office Visit: 04/30/2023   History of present illness: Abigail Palmer is a 28 y.o. female presenting today for food challenge.  She was last seen in the office on 03/18/23 by myself.  She is in her normal state of health today with out recent antihsitamine use or antibiotic needs.    From 03/18/23 - peanut IgE 0.41ku/L, arah2 0.39ku/L, arah6 0.4L ku/L Skin testing from 03/18/23 7mm wheal x 20mm flare  Review of systems: 10pt ROS negative unless noted above in HPI   All other systems negative unless noted above in HPI  Past medical/social/surgical/family history have been reviewed and are unchanged unless specifically indicated below.  No changes  Medication List: Current Outpatient Medications  Medication Sig Dispense Refill   docusate sodium (COLACE) 100 MG capsule Take 1 capsule (100 mg total) by mouth 2 (two) times daily as needed for mild constipation. (Patient not taking: Reported on 05/23/2020) 10 capsule 0   ELDERBERRY PO Take 1 tablet by mouth daily.     HAILEY 24 FE 1-20 MG-MCG(24) tablet Take 1 tablet by mouth daily.     ibuprofen (ADVIL) 800 MG tablet Take 1 tablet (800 mg total) by mouth every 8 (eight) hours as needed. 60 tablet 1   Prenatal Vit-Fe Fumarate-FA (PRENATAL MULTIVITAMIN) TABS tablet Take 1 tablet by mouth daily at 12 noon.     No current facility-administered medications for this visit.     Known medication allergies: Allergies  Allergen Reactions   Other Anaphylaxis   Penicillins     As a child. Unsure reaction     Physical examination: Blood pressure 108/80, pulse 79, temperature 98 F (36.7 C), temperature source Temporal, resp. rate 16, SpO2 99%, unknown if currently breastfeeding.  General: Alert, interactive, in no acute distress. HEENT: PERRLA, TMs pearly gray, turbinates non-edematous without discharge, post-pharynx non erythematous. Neck: Supple without  lymphadenopathy. Lungs: Clear to auscultation without wheezing, rhonchi or rales. {no increased work of breathing. CV: Normal S1, S2 without murmurs. Abdomen: Nondistended, nontender. Skin: Warm and dry, without lesions or rashes. Extremities:  No clubbing, cyanosis or edema. Neuro:   Grossly intact.  Diagnositics/Labs: Component     Latest Ref Rng 03/18/2023  F017-IgE Hazelnut ([place])     Class 0/I kU/L 0.14 !   F256-IgE Walnut     Class I kU/L 0.39 !   F202-IgE Cashew Nut     Class II kU/L 1.05 !   F018-IgE [place] Nut     Class 0 kU/L <0.10   Peanut, IgE     Class I kU/L 0.41 !   Macadamia Nut, IgE     Class 0/I kU/L 0.20 !   Pecan Nut IgE     Class 0 kU/L <0.10   F203-IgE Pistachio Nut     Class II kU/L 1.01 !   F020-IgE Almond     Class 0/I kU/L 0.13 !   F422-IgE Ara h 1     Class 0 kU/L <0.10   F423-IgE Ara h 2     Class I kU/L 0.39 !   F424-IgE Ara h 3     Class 0 kU/L <0.10   F447-IgE Ara h 6     Class I kU/L 0.40 !   F352-IgE Ara h 8     Class 0 kU/L <0.10   F427-IgE Ara h 9     Class 0 kU/L <0.10   Cor A 1 IgE  Class 0 kU/L <0.10   Cor A 8 IgE     Class 0/I kU/L 0.10 !   Cor A 9 IgE     Class 0 kU/L <0.10   Cor A 14 IgE     Class 0 kU/L <0.10   Jug R 1 IgE     Class 0/I kU/L 0.23 !   Jug R 3 IgE     Class 0/I kU/L 0.14 !   Allergen Comments Note   ANA O 3 IgE     Class III kU/L 1.71 !     Allergy testing:   Oral Challenge - 04/30/23 0900     Challenge Food/Drug Peanut Butter    Food/Drug provided by Patient    BP 110/64    Pulse 81    Respirations 18    Lungs 100    Skin clear    Time 0949    Dose lip rub    Skin hive    Time 1017    Dose 0.5 gram    Time 1035    Dose 0.5 gram    Skin clear    Time 1051    Dose 2 gram    Skin clear    Time 1109    Dose 4 gram    Skin Hive             Food challenge to peanut with use of peanut butter.  Benefits and risks of challenge discussed and consent obtained.  She was provided  with increasing doses of peanut butter every 15 minutes.  Initial dose within the throat.  Shortly after before she noted that she felt a little tingly at the tip of her tongue.  She was examined and tongue had a little bit of erythema at the tip but no other changes.  It was noted that she had a little high at the edge of her lower lip however I did give her a Vanicream lotion to apply to her lips that she said they were chapped and she did not have any lip balm.  She then noted that her skin is quite sensitive and she feels that the hives on the lip was related to the use of the Vanicream.  She wiped it off with water and this did improve.  We were able to continue with the dosing.  We got up to the 4 g dosing and after this dosing she noted that she had a little swelling of the upper mid lip.  This was visible on exam however rest of exam was completely normal.  When this was not resolving within 15 to 20 minutes we did give her a dose of Zyrtec.  Will challenge was discontinued.  She was observed for an additional hour before discharge.  Vitals were obtained prior to discharge and remained stable.    Assessment and plan:   Nut Allergy -Skin prick testing 03/18/23 was positive to peanut and pistachio -Serum IgE testing via bloodwork showed relatively low levels for peanut, hazelnut, walnut, cashew, macadamia nut, pistachio, almond -We have discussed recommendation for access to epinephrine device.  We made the shared decision that she will continue avoidance of nuts (peanuts and tree nuts).  - Food challenge able to be performed today to peanut butter as skin and blood levels were below the threshold for challenge.   She was able to eat 7 grams (out of 31 gram total challenge amount) of peanut butter before developing mild lip swelling that resolved  with Zyrtec.  7 grams of peanut butter is equivalent to about 5-6 peanuts   Penicillin Allergy History of reaction in childhood.  At this time likelihood  of continued allergy is extremely low -Recommend penicillin challenge in the future to confirm or rule out allergy.  Environmental Allergies -Recommend use of a nasal spray such as Flonase or Nasacort for symptom relief.  Use 2 sprays each nostril daily for 1-2 weeks at a time before stopping once nasal congestion improves for maximum benefit. -Use long-acting antihistamine like Zyrtec, Allegra or Xyzal daily as needed   I appreciate the opportunity to take part in Jaylissa's care. Please do not hesitate to contact me with questions.  Sincerely,   Margo Aye, MD Allergy/Immunology [facility]

## 2023-04-30 NOTE — Patient Instructions (Addendum)
Nut Allergy -Skin prick testing 03/18/23 was positive to peanut and pistachio -Serum IgE testing via bloodwork showed relatively low levels for peanut, hazelnut, walnut, cashew, macadamia nut, pistachio, almond -We have discussed recommendation for access to epinephrine device.  We made the shared decision that she will continue avoidance of nuts (peanuts and tree nuts).  - Food challenge able to be performed today to peanut butter as skin and blood levels were below the threshold for challenge.   She was able to eat 7 grams (out of 31 gram total challenge amount) of peanut butter before developing mild lip swelling that resolved with Zyrtec.  7 grams of peanut butter is equivalent to about 5-6 peanuts   Penicillin Allergy History of reaction in childhood.  At this time likelihood of continued allergy is extremely low -Recommend penicillin challenge in the future to confirm or rule out allergy.  Environmental Allergies -Recommend use of a nasal spray such as Flonase or Nasacort for symptom relief.  Use 2 sprays each nostril daily for 1-2 weeks at a time before stopping once nasal congestion improves for maximum benefit. -Use long-acting antihistamine like Zyrtec, Allegra or Xyzal daily as needed   Margo Aye, MD [facility]

## 2023-05-01 ENCOUNTER — Encounter: Payer: No Typology Code available for payment source | Admitting: Allergy

## 2023-05-26 NOTE — Telephone Encounter (Signed)
Patient called back stating her recruiter lost the letter and she is needing the letter resent to her.

## 2023-05-26 NOTE — Telephone Encounter (Addendum)
Called patient - DOB verified - advised letter is in her myChart - I can mail a copy as well.  Patient verbalized understanding - requested copy be mailed as well.  Letter has been printed and placed in provider's in basket to sign - will mail out on tomorrow, Wednesday, 05/27/23.

## 2023-06-15 LAB — HM PAP SMEAR: HPV, high-risk: NEGATIVE

## 2023-10-02 ENCOUNTER — Ambulatory Visit: Payer: Medicaid Other | Admitting: Family Medicine

## 2023-10-02 ENCOUNTER — Encounter: Payer: Self-pay | Admitting: Family Medicine

## 2023-10-02 VITALS — BP 112/62 | HR 62 | Temp 97.9°F | Ht 64.0 in | Wt 187.0 lb

## 2023-10-02 DIAGNOSIS — M25561 Pain in right knee: Secondary | ICD-10-CM | POA: Diagnosis not present

## 2023-10-02 DIAGNOSIS — J302 Other seasonal allergic rhinitis: Secondary | ICD-10-CM

## 2023-10-02 DIAGNOSIS — M25361 Other instability, right knee: Secondary | ICD-10-CM

## 2023-10-02 DIAGNOSIS — J309 Allergic rhinitis, unspecified: Secondary | ICD-10-CM | POA: Diagnosis not present

## 2023-10-02 DIAGNOSIS — M25362 Other instability, left knee: Secondary | ICD-10-CM

## 2023-10-02 DIAGNOSIS — N62 Hypertrophy of breast: Secondary | ICD-10-CM

## 2023-10-02 DIAGNOSIS — Z7689 Persons encountering health services in other specified circumstances: Secondary | ICD-10-CM

## 2023-10-02 DIAGNOSIS — S86891A Other injury of other muscle(s) and tendon(s) at lower leg level, right leg, initial encounter: Secondary | ICD-10-CM

## 2023-10-02 DIAGNOSIS — M549 Dorsalgia, unspecified: Secondary | ICD-10-CM

## 2023-10-02 DIAGNOSIS — S86892A Other injury of other muscle(s) and tendon(s) at lower leg level, left leg, initial encounter: Secondary | ICD-10-CM

## 2023-10-02 NOTE — Progress Notes (Signed)
 Established Patient Office Visit   Subjective  Patient ID: Abigail Palmer, female    DOB: [birth year]  Age: 28 y.o. MRN: [id number]  Chief Complaint  Patient presents with   New Patient (Initial Visit)    Est care; no major concerns; want to note that I work out and in the last 3-100mo been having R knee trouble; haven't been to any other doctor about it    Pt is a 28 yo female seen for est care follow-up.  Patient states she is overall healthy.  Seen by OB/GYN consistently.  Currently on OCPs.  Patient notes prominent bags underneath eyes.  Present whether patient gets enough sleep or not.  Has a history of seasonal allergies.  Not currently taking medication.  Has taken Zyrtec  in the past.  Right knee pain: Started 3-4 months ago.  Patient exercises 4-5 times per week.  Started wearing a knee sleeve and modifying workout by decreasing weights and avoiding certain lunges/leg exercises.  After workout knee will typically pop the next day.  Denies edema, erythema, instability, prior injury, history of playing sports.  Typically does not take anything for the pain.  Also has a burning pain in anterior bilateral lower legs with running and certain exercises.  Typically does not stretch long prior to exercising.  Patient inquires about breast lift/augmentation.  Currently wearing a size 36DD bra but may need a larger size.  Endorses bilateral upper back pain.  Working on posture.   Allergies: Penicillin  Social history: Patient has 2 kids.      Patient Active Problem List   Diagnosis Date Noted   Normal labor 08/16/2020   Normal delivery 01/29/2018   NSVD (normal spontaneous vaginal delivery) 01/29/2018   Past Medical History:  Diagnosis Date   Food allergy     Past Surgical History:  Procedure Laterality Date   WISDOM TOOTH EXTRACTION  2020   Social History   Tobacco Use   Smoking status: Never   Smokeless tobacco: Never  Vaping Use   Vaping status: Former   Substances:  Flavoring  Substance Use Topics   Alcohol use: Not Currently    Comment: occ.   Drug use: No   Family History  Problem Relation Age of Onset   Obesity Neg Hx    Hypertension Neg Hx    Diabetes Neg Hx    Cancer Neg Hx    Allergic rhinitis Neg Hx    Asthma Neg Hx    Eczema Neg Hx    Urticaria Neg Hx    Allergies  Allergen Reactions   Other Anaphylaxis   Penicillins     As a child. Unsure reaction    ROS Negative unless stated above    Objective:     BP 112/62   Pulse 62   Temp 97.9 F (36.6 C)   Ht 5' 4 (1.626 m)   Wt 187 lb (84.8 kg)   LMP  (LMP Unknown)   SpO2 98%   BMI 32.10 kg/m  BP Readings from Last 3 Encounters:  10/02/23 112/62  04/30/23 108/80  03/18/23 120/82   Wt Readings from Last 3 Encounters:  10/02/23 187 lb (84.8 kg)  03/18/23 188 lb 6.4 oz (85.5 kg)  08/16/20 216 lb 6.4 oz (98.2 kg)      Physical Exam Constitutional:      General: She is not in acute distress.    Appearance: Normal appearance.  HENT:     Head: Normocephalic and atraumatic.  Nose: Nose normal.     Right Turbinates: Enlarged and swollen.     Left Turbinates: Enlarged.     Mouth/Throat:     Mouth: Mucous membranes are moist.   Eyes:     General: Allergic shiner present.    Cardiovascular:     Rate and Rhythm: Normal rate and regular rhythm.     Heart sounds: Normal heart sounds. No murmur heard.    No gallop.  Pulmonary:     Effort: Pulmonary effort is normal. No respiratory distress.     Breath sounds: Normal breath sounds. No wheezing, rhonchi or rales.   Musculoskeletal:     Right knee: Bony tenderness present. Abnormal patellar mobility.     Instability Tests: Anterior drawer test negative. Posterior drawer test negative. Anterior Lachman test negative. Medial McMurray test negative and lateral McMurray test negative.     Left knee: Abnormal patellar mobility.       Legs:     Comments: Right lateral knee with TTP at joint line.  Bilateral patellar  instability, patellar tendon laxity.   Skin:    General: Skin is warm and dry.   Neurological:     Mental Status: She is alert and oriented to person, place, and time.        10/02/2023   10:57 AM  Depression screen PHQ 2/9  Decreased Interest 0  Down, Depressed, Hopeless 0  PHQ - 2 Score 0  Altered sleeping 0  Tired, decreased energy 1  Change in appetite 0  Feeling bad or failure about yourself  1  Trouble concentrating 0  Moving slowly or fidgety/restless 0  Suicidal thoughts 0  PHQ-9 Score 2  Difficult doing work/chores Not difficult at all      10/02/2023   10:58 AM  GAD 7 : Generalized Anxiety Score  Nervous, Anxious, on Edge 0  Control/stop worrying 0  Worry too much - different things 0  Trouble relaxing 0  Restless 0  Easily annoyed or irritable 1  Afraid - awful might happen 0  Total GAD 7 Score 1  Anxiety Difficulty Not difficult at all    No results found for any visits on 10/02/23.    Assessment & Plan:   Acute pain of right knee  Seasonal allergies  Allergic shiners  Patellar instability of both knees  Upper back pain -     Ambulatory referral to Plastic Surgery  Large breasts -     Ambulatory referral to Plastic Surgery  Encounter to establish care  Shin splints of both lower extremities  Patient seen to establish care.  Obtain records from previous providers.  Acute pain right knee likely multifactorial.  Patellar instability contributing to symptoms.  Discussed exercises to strengthen quads.  Lateral knee pain possibly 2/2 arthritis.  Also consider bursitis or meniscal injury.  Supportive care with heat, ice, stretching, topical analgesics, compression, rest.  For continued or worsening symptoms imaging, PT, referral.  Upper back pain 2/2 posture and breast size.  Discussed exercises to strengthen upper back, correcting posture, proper fitting bra.  Referral to plastics for consultation.  Bilateral shinsplints.  Discussed the  importance of stretching and supportive footwear.  Given handout on stretching exercises.  Patient with history of seasonal allergies.  Advised to start taking medication consistently with changes in season and weather.  Bags underneath eyes likely associated with allergies.  OTC antihistamine.  Can also try local honey.  Per chart review previously seen by allergist.  Return if symptoms worsen  or fail to improve.   Viola Greulich, MD

## 2024-04-10 ENCOUNTER — Ambulatory Visit
Admission: EM | Admit: 2024-04-10 | Discharge: 2024-04-10 | Disposition: A | Discharge status: Home / Self Care | Attending: Family Medicine | Admitting: Family Medicine

## 2024-04-10 DIAGNOSIS — B349 Viral infection, unspecified: Secondary | ICD-10-CM | POA: Diagnosis not present

## 2024-04-10 DIAGNOSIS — R051 Acute cough: Secondary | ICD-10-CM | POA: Diagnosis not present

## 2024-04-10 LAB — POCT INFLUENZA A/B
Influenza A, POC: NEGATIVE
Influenza B, POC: NEGATIVE

## 2024-04-10 LAB — POC SOFIA SARS ANTIGEN FIA: SARS Coronavirus 2 Ag: NEGATIVE

## 2024-04-10 MED ORDER — ACETAMINOPHEN 325 MG PO TABS
650.0000 mg | ORAL_TABLET | Freq: Once | ORAL | Status: AC
  Administered 2024-04-10: 650 mg via ORAL

## 2024-04-10 MED ORDER — BENZONATATE 200 MG PO CAPS: 200.0000 mg | ORAL_CAPSULE | Freq: Three times a day (TID) | ORAL | 0 refills | Status: AC | PRN

## 2024-04-10 NOTE — Discharge Instructions (Signed)
 You tested negative for COVID and flu.  You may take Tessalon  3 times a day as needed for your cough.  Continue Tylenol  or ibuprofen  as needed for fever.  Lots of rest and fluids.  Please follow-up with your PCP in 2 to 3 days for recheck.  Please go to emergency room for any worsening symptoms.  I hope you feel better soon!

## 2024-04-10 NOTE — ED Provider Notes (Signed)
 " [facility]    CSN: [id number] Arrival date & time: 04/10/24  1429      History   Chief Complaint Chief Complaint  Patient presents with   Cough   Fever    HPI Coby Antrobus is a 28 y.o. female  presents for evaluation of URI symptoms for 1.5 days. Patient reports associated symptoms of cough, congestion, body aches, fever. Denies N/V/D, sore throat, ear pain, shortness of breath. Patient does not have a hx of asthma. Patient is not an active smoker.   Reports no known sick contacts.  Pt has taken Tylenol  and Motrin  OTC for symptoms. Pt has no other concerns at this time.    Cough Associated symptoms: fever and myalgias   Fever Associated symptoms: congestion, cough and myalgias     Past Medical History:  Diagnosis Date   Food allergy      Patient Active Problem List   Diagnosis Date Noted   Normal labor 08/16/2020   Normal delivery 01/29/2018   NSVD (normal spontaneous vaginal delivery) 01/29/2018    Past Surgical History:  Procedure Laterality Date   WISDOM TOOTH EXTRACTION  2020    OB History     Gravida  2   Para  2   Term  2   Preterm      AB      Living  2      SAB      IAB      Ectopic      Multiple  0   Live Births  2            Home Medications    Prior to Admission medications  Medication Sig Start Date End Date Taking? Authorizing Provider  benzonatate  (TESSALON ) 200 MG capsule Take 1 capsule (200 mg total) by mouth 3 (three) times daily as needed. 04/10/24  Yes Rhemi Balbach, Jodi R, NP  Biotin 10 MG TABS Biotin    [provider]  docusate sodium  (COLACE) 100 MG capsule Take 1 capsule (100 mg total) by mouth 2 (two) times daily as needed for mild constipation. Patient not taking: Reported on 10/02/2023 01/31/18   Estelle Service, MD  ELDERBERRY PO Take 1 tablet by mouth daily.    [provider]  HAILEY 24 FE 1-20 MG-MCG(24) tablet Take 1 tablet by mouth daily. 03/14/23   [provider]   ibuprofen  (ADVIL ) 800 MG tablet Take 1 tablet (800 mg total) by mouth every 8 (eight) hours as needed. 08/18/20   Shivaji, Lavonia HERO, MD  naproxen  (NAPROSYN ) 375 MG tablet 1 tablet Orally Twice a day with food for 30 day(s)    [provider]  Prenatal Vit-Fe Fumarate-FA (PRENATAL MULTIVITAMIN) TABS tablet Take 1 tablet by mouth daily at 12 noon.    [provider]    Family History Family History  Problem Relation Age of Onset   Obesity Neg Hx    Hypertension Neg Hx    Diabetes Neg Hx    Cancer Neg Hx    Allergic rhinitis Neg Hx    Asthma Neg Hx    Eczema Neg Hx    Urticaria Neg Hx     Social History Social History[1]   Allergies   Other and Penicillins   Review of Systems Review of Systems  Constitutional:  Positive for fever.  HENT:  Positive for congestion.   Respiratory:  Positive for cough.   Musculoskeletal:  Positive for myalgias.     Physical Exam Triage Vital  Signs ED Triage Vitals  Encounter Vitals Group     BP 04/10/24 1527 119/85     Girls Systolic BP Percentile --      Girls Diastolic BP Percentile --      Boys Systolic BP Percentile --      Boys Diastolic BP Percentile --      Pulse Rate 04/10/24 1527 (!) 104     Resp 04/10/24 1527 16     Temp 04/10/24 1527 (!) 100.7 F (38.2 C)     Temp src --      SpO2 04/10/24 1527 97 %     Weight --      Height --      Head Circumference --      Peak Flow --      Pain Score 04/10/24 1526 3     Pain Loc --      Pain Education --      Exclude from Growth Chart --    No data found.  Updated Vital Signs BP 119/85   Pulse (!) 104   Temp (!) 100.7 F (38.2 C)   Resp 16   SpO2 97%   Breastfeeding No   Visual Acuity Right Eye Distance:   Left Eye Distance:   Bilateral Distance:    Right Eye Near:   Left Eye Near:    Bilateral Near:     Physical Exam Vitals and nursing note reviewed.  Constitutional:      General: She is not in acute distress.    Appearance: She is  well-developed. She is not ill-appearing.  HENT:     Head: Normocephalic and atraumatic.     Right Ear: Tympanic membrane and ear canal normal.     Left Ear: Tympanic membrane and ear canal normal.     Nose: Congestion present.     Mouth/Throat:     Mouth: Mucous membranes are moist.     Pharynx: Oropharynx is clear. Uvula midline. No oropharyngeal exudate or posterior oropharyngeal erythema.     Tonsils: No tonsillar exudate or tonsillar abscesses.  Eyes:     Conjunctiva/sclera: Conjunctivae normal.     Pupils: Pupils are equal, round, and reactive to light.  Cardiovascular:     Rate and Rhythm: Normal rate and regular rhythm.     Heart sounds: Normal heart sounds.  Pulmonary:     Effort: Pulmonary effort is normal.     Breath sounds: Normal breath sounds. No wheezing, rhonchi or rales.  Musculoskeletal:     Cervical back: Normal range of motion and neck supple.  Lymphadenopathy:     Cervical: No cervical adenopathy.  Skin:    General: Skin is warm and dry.  Neurological:     General: No focal deficit present.     Mental Status: She is alert and oriented to person, place, and time.  Psychiatric:        Mood and Affect: Mood normal.        Behavior: Behavior normal.      UC Treatments / Results  Labs (all labs ordered are listed, but only abnormal results are displayed) Labs Reviewed  POCT INFLUENZA A/B  POC SOFIA SARS ANTIGEN FIA    EKG   Radiology No results found.  Procedures Procedures (including critical care time)  Medications Ordered in UC Medications  acetaminophen  (TYLENOL ) tablet 650 mg (650 mg Oral Given 04/10/24 1533)    Initial Impression / Assessment and Plan / UC Course  I have reviewed the triage  vital signs and the nursing notes.  Pertinent labs & imaging results that were available during my care of the patient were reviewed by me and considered in my medical decision making (see chart for details).     Reviewed exam and symptoms with  patient.  No red flags.  Negative COVID flu testing.  Discussed viral illness and symptomatic treatment.  Tessalon  as needed for cough.  Encourage rest fluids and PCP follow-up 2 to 3 days for recheck.  ER precautions reviewed. Final Clinical Impressions(s) / UC Diagnoses   Final diagnoses:  Acute cough  Viral illness     Discharge Instructions      You tested negative for COVID and flu.  You may take Tessalon  3 times a day as needed for your cough.  Continue Tylenol  or ibuprofen  as needed for fever.  Lots of rest and fluids.  Please follow-up with your PCP in 2 to 3 days for recheck.  Please go to emergency room for any worsening symptoms.  I hope you feel better soon!     ED Prescriptions     Medication Sig Dispense Auth. Provider   benzonatate  (TESSALON ) 200 MG capsule Take 1 capsule (200 mg total) by mouth 3 (three) times daily as needed. 20 capsule Justn Quale, Jodi R, NP      PDMP not reviewed this encounter.     [1]  Social History Tobacco Use   Smoking status: Never   Smokeless tobacco: Never  Vaping Use   Vaping status: Former   Substances: Flavoring  Substance Use Topics   Alcohol use: Not Currently    Comment: occ.   Drug use: No     Loreda Myla SAUNDERS, NP 04/10/24 1606  "

## 2024-04-10 NOTE — ED Triage Notes (Signed)
 Pt present with c/o cough, fever, chills body aches x yesterday afternoon.  Home interventions: Tylenol  and Motrin 

## 2024-04-21 ENCOUNTER — Ambulatory Visit: Payer: Self-pay

## 2024-04-21 ENCOUNTER — Encounter: Payer: Self-pay | Admitting: Family Medicine

## 2024-04-21 ENCOUNTER — Ambulatory Visit (INDEPENDENT_AMBULATORY_CARE_PROVIDER_SITE_OTHER): Admitting: Family Medicine

## 2024-04-21 ENCOUNTER — Other Ambulatory Visit: Payer: Self-pay | Admitting: Family Medicine

## 2024-04-21 VITALS — BP 102/64 | HR 73 | Temp 98.5°F | Ht 64.0 in | Wt 185.4 lb

## 2024-04-21 DIAGNOSIS — J00 Acute nasopharyngitis [common cold]: Secondary | ICD-10-CM

## 2024-04-21 DIAGNOSIS — G43809 Other migraine, not intractable, without status migrainosus: Secondary | ICD-10-CM | POA: Diagnosis not present

## 2024-04-21 DIAGNOSIS — H6501 Acute serous otitis media, right ear: Secondary | ICD-10-CM | POA: Diagnosis not present

## 2024-04-21 MED ORDER — DOXYCYCLINE HYCLATE 100 MG PO TABS: 100.0000 mg | ORAL_TABLET | Freq: Two times a day (BID) | ORAL | 0 refills | Status: AC

## 2024-04-21 MED ORDER — SUMATRIPTAN SUCCINATE 50 MG PO TABS: 50.0000 mg | ORAL_TABLET | ORAL | 1 refills | Status: AC | PRN

## 2024-04-21 NOTE — Telephone Encounter (Signed)
Appt 1/8

## 2024-04-21 NOTE — Progress Notes (Signed)
 "  Established Patient Office Visit   Subjective  Patient ID: Abigail Palmer, female    DOB: [birth year]  Age: 29 y.o. MRN: [id number]  Chief Complaint  Patient presents with   Acute Visit    Patient came in today for headache started 3 days ago on the ride side, patient states she had a cold 1 1/2 weeks ago, and a toothache, patient hasn't had a recent eye exam    Pt isa 29 yo female seen for acute concern.  Pt with cold like symptoms including fever, chills, rhinorrhea, cough, ST x 1.5 wks ago.  Seen at [facility], COVID, Flu testing negative.  Rhinorrhea and cough remain.  Developed R sided HA with 7/10 pain/pressure in R face, frontal area, and temporal area.  Has h/o migraines, but has on had 3-4 in th the 2-3 yrs since being dx'd.  No sensitivity to sound, n/v.  Some blurred vision.  Sensitivity to light if looks directly at it.  No changes in water intake, foods, increased stress, or decreased sleep.  Tried allergy  med, tylenol , and excedrin.      Patient Active Problem List   Diagnosis Date Noted   Normal labor 08/16/2020   Normal delivery 01/29/2018   NSVD (normal spontaneous vaginal delivery) 01/29/2018   Past Medical History:  Diagnosis Date   Food allergy     Past Surgical History:  Procedure Laterality Date   WISDOM TOOTH EXTRACTION  2020   Social History[1] Family History  Problem Relation Age of Onset   Obesity Neg Hx    Hypertension Neg Hx    Diabetes Neg Hx    Cancer Neg Hx    Allergic rhinitis Neg Hx    Asthma Neg Hx    Eczema Neg Hx    Urticaria Neg Hx    Allergies[2]  ROS Negative unless stated above    Objective:     BP 102/64 (BP Location: Left Arm, Patient Position: Sitting, Cuff Size: Large)   Pulse 73   Temp 98.5 F (36.9 C) (Oral)   Ht 5' 4 (1.626 m)   Wt 185 lb 6.4 oz (84.1 kg)   LMP  (LMP Unknown)   SpO2 99%   BMI 31.82 kg/m  BP Readings from Last 3 Encounters:  04/21/24 102/64  04/10/24 119/85  10/02/23 112/62   Wt Readings from Last 3  Encounters:  04/21/24 185 lb 6.4 oz (84.1 kg)  10/02/23 187 lb (84.8 kg)  03/18/23 188 lb 6.4 oz (85.5 kg)   Vision Screening   Right eye Left eye Both eyes  Without correction 20/25 20/20 20/25   With correction        Physical Exam Constitutional:      General: She is not in acute distress.    Appearance: Normal appearance.     Comments: Appears tired, bags underneath eyes.  HENT:     Head: Normocephalic and atraumatic.     Right Ear: Hearing, ear canal and external ear normal. Tympanic membrane is erythematous and bulging.     Left Ear: Hearing, ear canal and external ear normal. A middle ear effusion is present. Tympanic membrane is erythematous and bulging.     Ears:     Comments: B/l TMs full, R>L.  Scant suppurative fluid R TM.    Nose: Rhinorrhea present.     Right Turbinates: Enlarged and swollen.     Left Turbinates: Swollen.     Right Sinus: No maxillary sinus tenderness or frontal sinus tenderness.  Left Sinus: No maxillary sinus tenderness or frontal sinus tenderness.     Comments: R nares extremely narrow.      Mouth/Throat:     Mouth: Mucous membranes are moist.     Pharynx: Postnasal drip present.  Cardiovascular:     Rate and Rhythm: Normal rate and regular rhythm.     Heart sounds: Normal heart sounds. No murmur heard.    No gallop.  Pulmonary:     Effort: Pulmonary effort is normal. No respiratory distress.     Breath sounds: Normal breath sounds. No wheezing, rhonchi or rales.     Comments: Productive cough Skin:    General: Skin is warm and dry.  Neurological:     Mental Status: She is alert and oriented to person, place, and time.        04/21/2024   12:01 PM 10/02/2023   10:57 AM  Depression screen PHQ 2/9  Decreased Interest 0 0  Down, Depressed, Hopeless 0 0  PHQ - 2 Score 0 0  Altered sleeping 0 0  Tired, decreased energy 3 1  Change in appetite 0 0  Feeling bad or failure about yourself  0 1  Trouble concentrating 0 0  Moving slowly  or fidgety/restless 0 0  Suicidal thoughts 0 0  PHQ-9 Score 3 2   Difficult doing work/chores Not difficult at all Not difficult at all     Data saved with a previous flowsheet row definition      04/21/2024   12:01 PM 10/02/2023   10:58 AM  GAD 7 : Generalized Anxiety Score  Nervous, Anxious, on Edge 0 0  Control/stop worrying 0 0  Worry too much - different things 0 0  Trouble relaxing 0 0  Restless 0 0  Easily annoyed or irritable 0 1  Afraid - awful might happen 0 0  Total GAD 7 Score 0 1  Anxiety Difficulty  Not difficult at all     No results found for any visits on 04/21/24.    Assessment & Plan:   Right acute serous otitis media, recurrence not specified -     Doxycycline  Hyclate; Take 1 tablet (100 mg total) by mouth 2 (two) times daily for 7 days.  Dispense: 14 tablet; Refill: 0  Other migraine without status migrainosus, not intractable -     SUMAtriptan  Succinate; Take 1 tablet (50 mg total) by mouth every 2 (two) hours as needed for migraine. May repeat in 2 hours if headache persists or recurs.  Dispense: 10 tablet; Refill: 1  Acute nasopharyngitis  Start ABX for OM.  History of penicillin allergy .  Continue supportive care including OTC cough/cold medication, allergy  medication, nasal spray or saline nasal rinse.  Continued migraine.  Possibly triggered by AOM.  Hydration encouraged.  Imitrex  as needed.  Given strict precautions.  Also advised to have vision checked.  Continue wearing bluelight blocking glasses while working on computer screen.  Return if symptoms worsen or fail to improve.   Abigail JONELLE Single, MD    [1]  Social History Tobacco Use   Smoking status: Never   Smokeless tobacco: Never  Vaping Use   Vaping status: Former   Substances: Flavoring  Substance Use Topics   Alcohol use: Not Currently    Comment: occ.   Drug use: No  [2]  Allergies Allergen Reactions   Other Anaphylaxis   Penicillins     As a child. Unsure reaction   "

## 2024-04-21 NOTE — Telephone Encounter (Signed)
 FYI Only or Action Required?: Action required by provider: request for appointment.  Patient was last seen in primary care on 10/02/2023 by Mercer Clotilda SAUNDERS, MD.  Called Nurse Triage reporting Headache.  Symptoms began several days ago.  Interventions attempted: OTC medications: Excedrin.  Symptoms are: unchanged.Had a toothache first, then developed a headache. Excedrin not helping.  Triage Disposition: See HCP Within 4 Hours (Or PCP Triage)  Patient/caregiver understands and will follow disposition?: Yes     Copied from CRM #[id number]. Topic: Clinical - Red Word Triage >> Apr 21, 2024  8:15 AM Avram MATSU wrote: Red Word that prompted transfer to Nurse Triage: patient has an appt already and stated she had headaches for 4 days that wont go away. Reason for Disposition  [1] SEVERE headache (e.g., excruciating) AND [2] not improved after 2 hours of pain medicine  Answer Assessment - Initial Assessment Questions 1. LOCATION: Where does it hurt?      Front right 2. ONSET: When did the headache start? (e.g., minutes, hours, days)      4 days, getting worse 3. PATTERN: Does the pain come and go, or has it been constant since it started?     Comes and goes 4. SEVERITY: How bad is the pain? and What does it keep you from doing?  (e.g., Scale 1-10; mild, moderate, or severe)     6-7 5. RECURRENT SYMPTOM: Have you ever had headaches before? If Yes, ask: When was the last time? and What happened that time?      yes 6. CAUSE: What do you think is causing the headache?     unsure 7. MIGRAINE: Have you been diagnosed with migraine headaches? If Yes, ask: Is this headache similar?      no 8. HEAD INJURY: Has there been any recent injury to your head?      no 9. OTHER SYMPTOMS: Do you have any other symptoms? (e.g., fever, stiff neck, eye pain, sore throat, cold symptoms)     no 10. PREGNANCY: Is there any chance you are pregnant? When was your last menstrual  period?       no  Protocols used: Headache-A-AH

## 2024-05-01 ENCOUNTER — Ambulatory Visit
Admission: EM | Admit: 2024-05-01 | Discharge: 2024-05-01 | Disposition: A | Discharge status: Home / Self Care | Attending: Family Medicine | Admitting: Family Medicine

## 2024-05-01 DIAGNOSIS — S61412A Laceration without foreign body of left hand, initial encounter: Secondary | ICD-10-CM

## 2024-05-01 DIAGNOSIS — M79642 Pain in left hand: Secondary | ICD-10-CM

## 2024-05-01 NOTE — Discharge Instructions (Signed)
 WOUND CARE Please return in 10 days to have your stitches/staples removed or sooner if you have concerns.  Keep area clean and dry  Tonight remove the dressing, wash the wound and reapply once more over night. Leave the wound open to the air and keep it dry unless you will be active with your hands. Covering will be better in this case to prevent infection.  Continue daily cleansing with soap and water until stitches/staples are removed.  Do not apply any ointments or creams to the wound while stitches/staples are in place, as this may cause delayed healing.  Notify the office if you experience any of the following signs of infection: Swelling, redness, pus drainage, streaking, fever >101.0 F  Notify the office if you experience excessive bleeding that does not stop after 15-20 minutes of constant, firm pressure.

## 2024-05-01 NOTE — ED Provider Notes (Signed)
 " [facility]  Note:  This document was prepared using Conservation officer, historic buildings and may include unintentional dictation errors.  MRN: [id number] DOB: 1995-07-15  Subjective:   Abigail Palmer is a 29 y.o. female presenting for suffering a left hand laceration over the palmar aspect between the left 2nd and 3rd finger approximately 30 minutes prior to arrival in clinic.  Patient was cutting an avocado and inadvertently cut herself.  Tdap is up-to-date.  Current Outpatient Medications  Medication Instructions   benzonatate  (TESSALON ) 200 mg, Oral, 3 times daily PRN   Biotin 10 MG TABS Biotin   docusate sodium  (COLACE) 100 mg, Oral, 2 times daily PRN   ELDERBERRY PO 1 tablet, Daily   HAILEY 24 FE 1-20 MG-MCG(24) tablet 1 tablet, Daily   ibuprofen  (ADVIL ) 800 mg, Oral, Every 8 hours PRN   naproxen  (NAPROSYN ) 375 MG tablet 1 tablet Orally Twice a day with food for 30 day(s)   Prenatal Vit-Fe Fumarate-FA (PRENATAL MULTIVITAMIN) TABS tablet 1 tablet, Daily   SUMAtriptan  (IMITREX ) 50 mg, Oral, Every 2 hours PRN, May repeat in 2 hours if headache persists or recurs.    Allergies[1]  Past Medical History:  Diagnosis Date   Food allergy       Past Surgical History:  Procedure Laterality Date   WISDOM TOOTH EXTRACTION  2020    Family History  Problem Relation Age of Onset   Obesity Neg Hx    Hypertension Neg Hx    Diabetes Neg Hx    Cancer Neg Hx    Allergic rhinitis Neg Hx    Asthma Neg Hx    Eczema Neg Hx    Urticaria Neg Hx     Social History   Occupational History   Not on file  Tobacco Use   Smoking status: Never   Smokeless tobacco: Never  Vaping Use   Vaping status: Never Used  Substance and Sexual Activity   Alcohol use: Yes    Comment: occ.   Drug use: No   Sexual activity: Not on file     ROS   Objective:   Vitals: BP 108/75 (BP Location: Left Arm)   Pulse 81   Temp 98.7 F (37.1 C) (Oral)   Resp 16   LMP  (LMP  Unknown)   SpO2 95%   Physical Exam Constitutional:      General: She is not in acute distress.    Appearance: Normal appearance. She is well-developed. She is not ill-appearing, toxic-appearing or diaphoretic.  HENT:     Head: Normocephalic and atraumatic.     Nose: Nose normal.     Mouth/Throat:     Mouth: Mucous membranes are moist.  Eyes:     General: No scleral icterus.       Right eye: No discharge.        Left eye: No discharge.     Extraocular Movements: Extraocular movements intact.  Cardiovascular:     Rate and Rhythm: Normal rate.  Pulmonary:     Effort: Pulmonary effort is normal.  Musculoskeletal:       Hands:  Skin:    General: Skin is warm and dry.  Neurological:     General: No focal deficit present.     Mental Status: She is alert and oriented to person, place, and time.  Psychiatric:        Mood and Affect: Mood normal.        Behavior: Behavior normal.    PROCEDURE  NOTE: laceration repair Verbal consent obtained from patient.  Local anesthesia with 2cc Lidocaine  1% with epinephrine.  Wound explored for tendon, ligament damage. Wound scrubbed with soap and water and rinsed. Wound closed with #1 4-0 Ethilon (horizontal mattress) sutures.  Wound cleansed and dressed.   Assessment and Plan :   PDMP not reviewed this encounter.  1. Left hand pain   2. Laceration of left hand without foreign body, initial encounter      Laceration repaired successfully. Wound care reviewed. Recommended Tylenol  and/or ibuprofen  for pain control. Return-to-clinic precautions discussed, patient verbalized understanding. Otherwise, follow up in 10 days for suture removal. Counseled patient on potential for adverse effects with medications prescribed/recommended today, ER and return-to-clinic precautions discussed, patient verbalized understanding.     [1]  Allergies Allergen Reactions   Penicillins     As a child. Unsure reaction   Other Rash    TREE NUTS     Christopher Savannah, [place] 05/01/24 1143  "

## 2024-05-01 NOTE — ED Triage Notes (Addendum)
 Pt states she cut her hand between index and middle finger while cutting avocado ~30 min PTA-puncture wound noted/bleeding controlled-NAD-steady gait

## 2024-05-17 ENCOUNTER — Ambulatory Visit
Admission: EM | Admit: 2024-05-17 | Discharge: 2024-05-17 | Disposition: A | Discharge status: Home / Self Care | Source: Home / Self Care

## 2024-05-17 DIAGNOSIS — Z4802 Encounter for removal of sutures: Secondary | ICD-10-CM

## 2024-05-17 NOTE — ED Triage Notes (Signed)
 Patient presents for overdue stitch removal on (L) hand. Denies any concerns regarding laceration site.
